# Patient Record
Sex: Female | Born: 1975 | Race: White | Hispanic: No | Marital: Married | Smoking: Former smoker
Health system: Southern US, Community
[De-identification: ages and names within clinical notes are randomized; demographics above are authoritative.]

## PROBLEM LIST (undated history)

## (undated) DIAGNOSIS — G500 Trigeminal neuralgia: Secondary | ICD-10-CM

## (undated) DIAGNOSIS — I499 Cardiac arrhythmia, unspecified: Secondary | ICD-10-CM

## (undated) DIAGNOSIS — Z8719 Personal history of other diseases of the digestive system: Secondary | ICD-10-CM

## (undated) DIAGNOSIS — F419 Anxiety disorder, unspecified: Secondary | ICD-10-CM

## (undated) DIAGNOSIS — I739 Peripheral vascular disease, unspecified: Secondary | ICD-10-CM

## (undated) HISTORY — DX: Personal history of other diseases of the digestive system: Z87.19

## (undated) HISTORY — PX: NO PAST SURGERIES: SHX2092

---

## 2001-03-25 ENCOUNTER — Emergency Department (HOSPITAL_COMMUNITY): Admission: EM | Admit: 2001-03-25 | Discharge: 2001-03-25 | Payer: Self-pay

## 2003-10-15 ENCOUNTER — Inpatient Hospital Stay (HOSPITAL_COMMUNITY): Admission: AD | Admit: 2003-10-15 | Discharge: 2003-10-15 | Payer: Self-pay | Admitting: Obstetrics & Gynecology

## 2003-11-09 ENCOUNTER — Inpatient Hospital Stay (HOSPITAL_COMMUNITY): Admission: AD | Admit: 2003-11-09 | Discharge: 2003-11-09 | Payer: Self-pay | Admitting: Obstetrics and Gynecology

## 2003-11-09 ENCOUNTER — Inpatient Hospital Stay (HOSPITAL_COMMUNITY): Admission: AD | Admit: 2003-11-09 | Discharge: 2003-11-11 | Payer: Self-pay | Admitting: Obstetrics and Gynecology

## 2004-01-01 ENCOUNTER — Other Ambulatory Visit: Admission: RE | Admit: 2004-01-01 | Discharge: 2004-01-01 | Payer: Self-pay | Admitting: Obstetrics and Gynecology

## 2004-09-08 ENCOUNTER — Ambulatory Visit: Payer: Self-pay | Admitting: Internal Medicine

## 2005-01-26 ENCOUNTER — Ambulatory Visit: Payer: Self-pay | Admitting: Internal Medicine

## 2005-03-24 ENCOUNTER — Other Ambulatory Visit: Admission: RE | Admit: 2005-03-24 | Discharge: 2005-03-24 | Payer: Self-pay | Admitting: Obstetrics and Gynecology

## 2007-01-08 ENCOUNTER — Emergency Department (HOSPITAL_COMMUNITY): Admission: EM | Admit: 2007-01-08 | Discharge: 2007-01-08 | Payer: Self-pay | Admitting: Emergency Medicine

## 2009-03-25 ENCOUNTER — Ambulatory Visit (HOSPITAL_COMMUNITY)
Admission: RE | Admit: 2009-03-25 | Discharge: 2009-03-25 | Payer: Self-pay | Admitting: Physical Medicine and Rehabilitation

## 2010-03-12 ENCOUNTER — Ambulatory Visit (HOSPITAL_COMMUNITY)
Admission: RE | Admit: 2010-03-12 | Discharge: 2010-03-12 | Payer: Self-pay | Admitting: Physical Medicine and Rehabilitation

## 2010-10-01 IMAGING — CR DG LUMBAR SPINE COMPLETE 4+V
5 series · 5 of 5 positions shown · non-contrast
Comparison: None.

CLINICAL DATA: Low back pain

LUMBAR SPINE - COMPLETE 4+ VIEW

[view not recorded (1 of 5)]
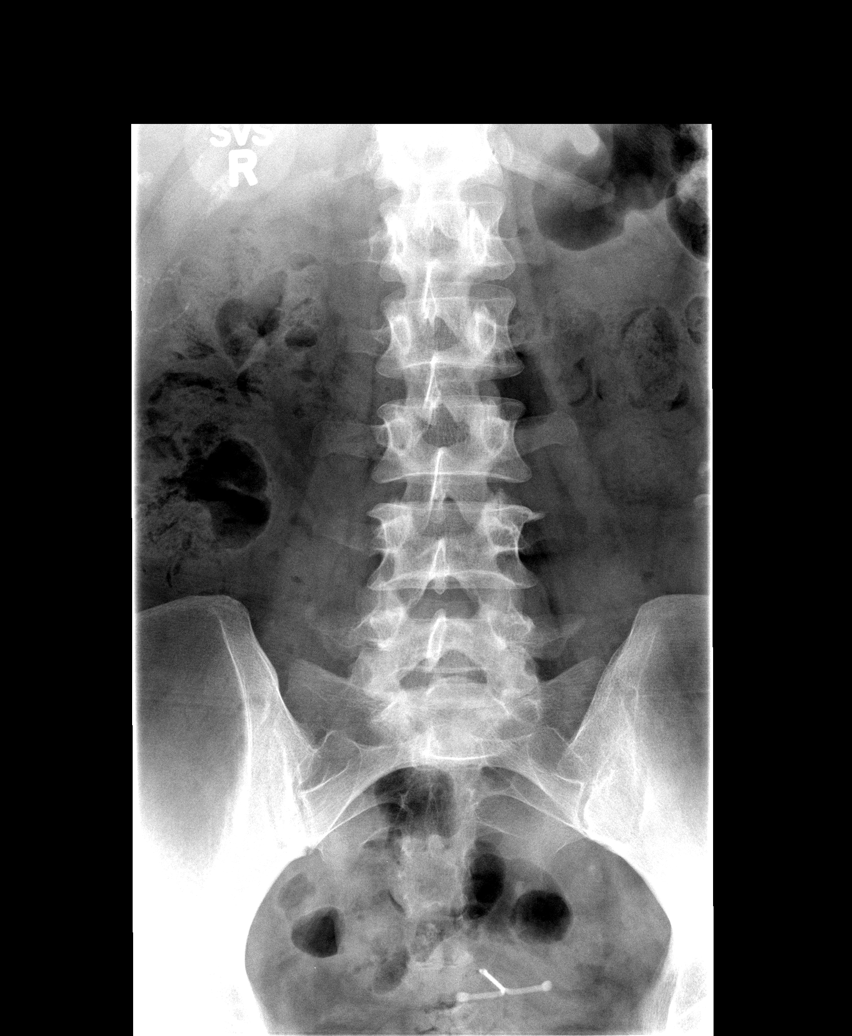

[view not recorded (2 of 5)]
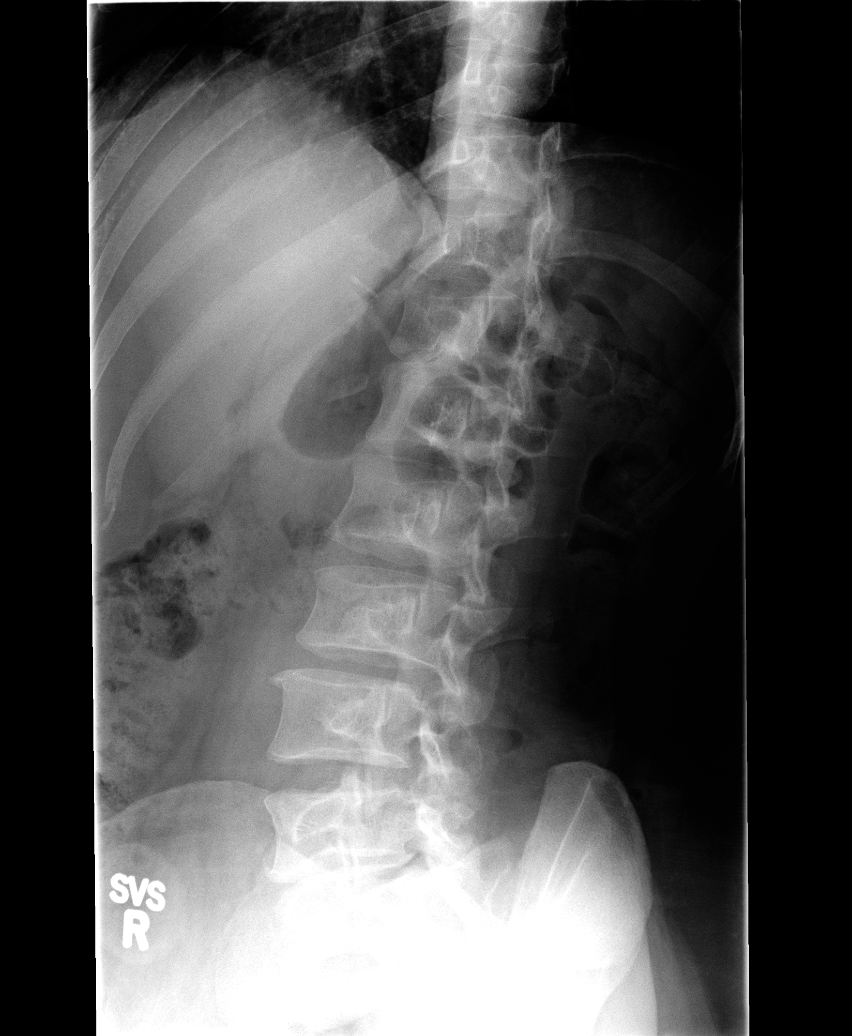

[view not recorded (3 of 5)]
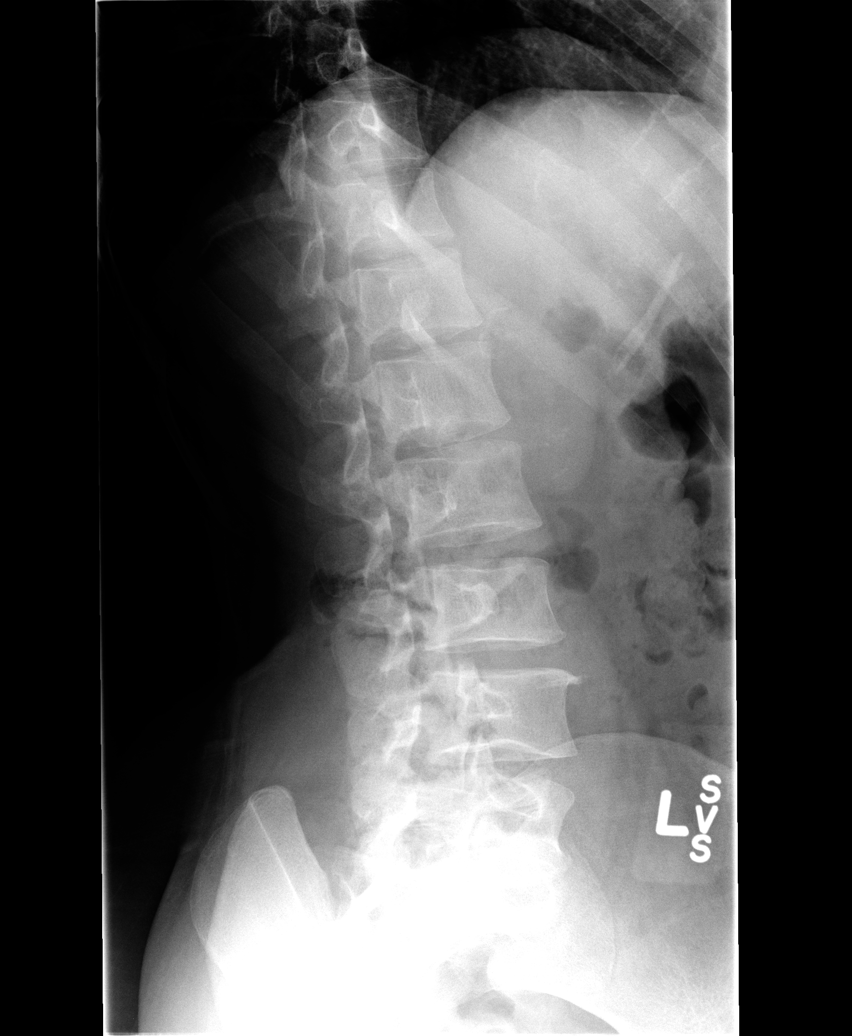

[view not recorded (4 of 5)]
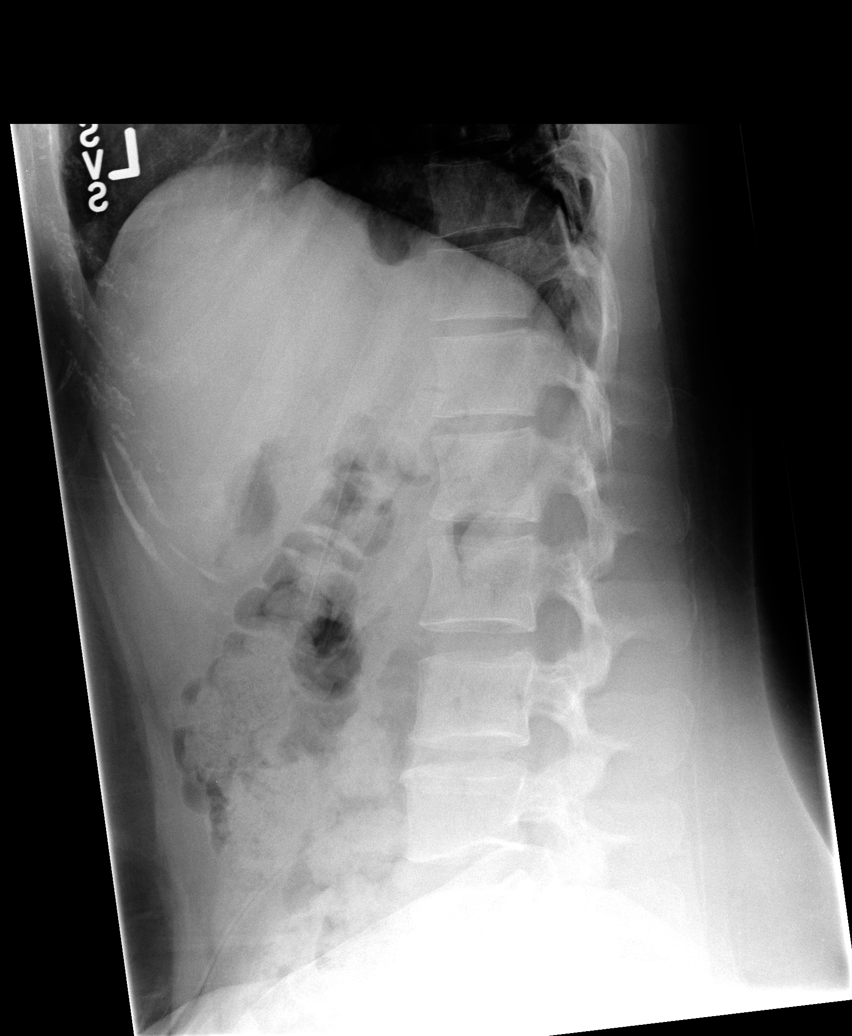

[view not recorded (5 of 5)]
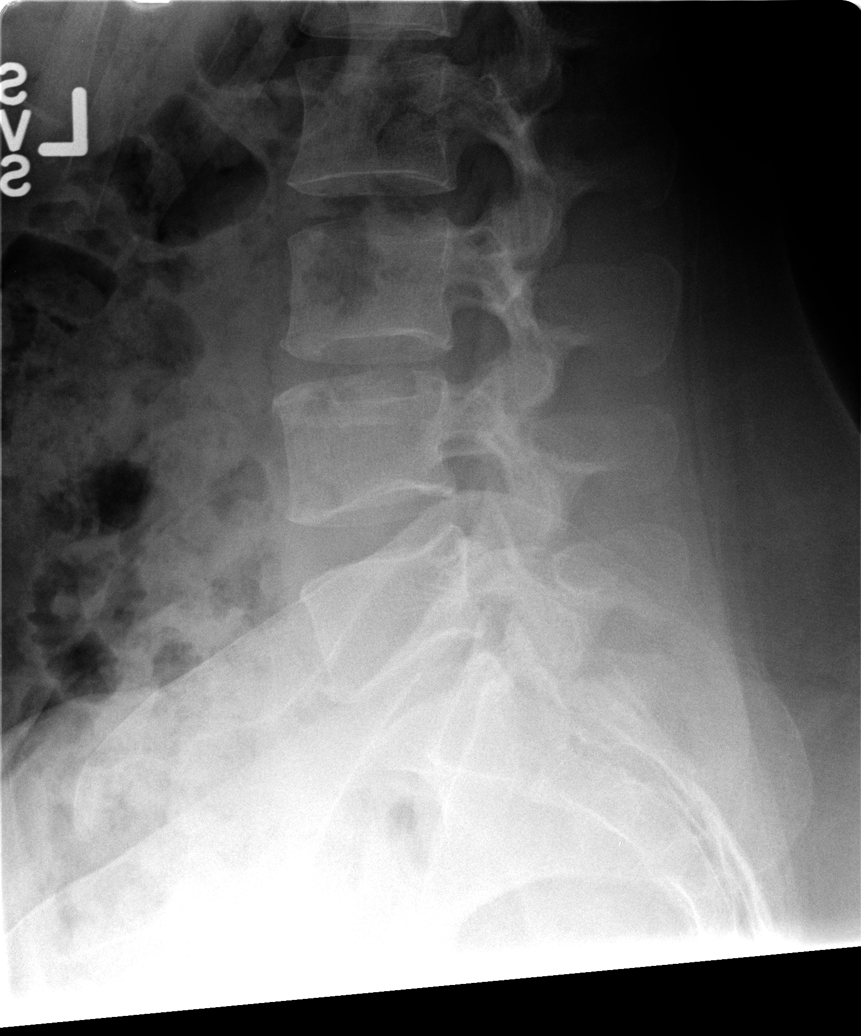

[5 of 5 positions shown; findings below may reference images not displayed]

FINDINGS: Normal alignment of the lumbar vertebral bodies.  No loss
vertebral body height or disc height.  No subluxation.  There is
mild endplate osteophytosis and L1-L5.  Oblique projections
demonstrate no evidence of pars fracture.
IMPRESSION: 1.  No acute findings of the lumbar spine.
2.  Mild disc osteophytic disease of the lower lumbar spine.

## 2013-05-09 ENCOUNTER — Ambulatory Visit (HOSPITAL_COMMUNITY)
Admission: RE | Admit: 2013-05-09 | Discharge: 2013-05-09 | Disposition: A | Discharge status: Home / Self Care | Payer: BC Managed Care – PPO | Source: Ambulatory Visit | Attending: Physical Medicine and Rehabilitation | Admitting: Physical Medicine and Rehabilitation

## 2013-05-09 ENCOUNTER — Other Ambulatory Visit (HOSPITAL_COMMUNITY): Payer: Self-pay | Admitting: Physical Medicine and Rehabilitation

## 2013-05-09 DIAGNOSIS — M545 Low back pain, unspecified: Secondary | ICD-10-CM | POA: Insufficient documentation

## 2013-05-09 DIAGNOSIS — M51379 Other intervertebral disc degeneration, lumbosacral region without mention of lumbar back pain or lower extremity pain: Secondary | ICD-10-CM | POA: Insufficient documentation

## 2013-05-09 DIAGNOSIS — M47817 Spondylosis without myelopathy or radiculopathy, lumbosacral region: Secondary | ICD-10-CM

## 2013-05-09 DIAGNOSIS — G894 Chronic pain syndrome: Secondary | ICD-10-CM

## 2013-05-09 DIAGNOSIS — M5137 Other intervertebral disc degeneration, lumbosacral region: Secondary | ICD-10-CM | POA: Insufficient documentation

## 2013-11-16 ENCOUNTER — Ambulatory Visit (INDEPENDENT_AMBULATORY_CARE_PROVIDER_SITE_OTHER): Payer: BC Managed Care – PPO | Admitting: Family Medicine

## 2013-11-16 ENCOUNTER — Encounter: Payer: Self-pay | Admitting: Family Medicine

## 2013-11-16 VITALS — BP 108/68 | HR 77 | Temp 98.3°F | Ht 65.5 in | Wt 227.8 lb

## 2013-11-16 DIAGNOSIS — F411 Generalized anxiety disorder: Secondary | ICD-10-CM

## 2013-11-16 DIAGNOSIS — Z720 Tobacco use: Secondary | ICD-10-CM | POA: Insufficient documentation

## 2013-11-16 DIAGNOSIS — L408 Other psoriasis: Secondary | ICD-10-CM

## 2013-11-16 DIAGNOSIS — D179 Benign lipomatous neoplasm, unspecified: Secondary | ICD-10-CM

## 2013-11-16 DIAGNOSIS — Z23 Encounter for immunization: Secondary | ICD-10-CM

## 2013-11-16 DIAGNOSIS — F17200 Nicotine dependence, unspecified, uncomplicated: Secondary | ICD-10-CM

## 2013-11-16 DIAGNOSIS — R5381 Other malaise: Secondary | ICD-10-CM

## 2013-11-16 DIAGNOSIS — F419 Anxiety disorder, unspecified: Secondary | ICD-10-CM | POA: Insufficient documentation

## 2013-11-16 DIAGNOSIS — K219 Gastro-esophageal reflux disease without esophagitis: Secondary | ICD-10-CM | POA: Insufficient documentation

## 2013-11-16 DIAGNOSIS — L409 Psoriasis, unspecified: Secondary | ICD-10-CM

## 2013-11-16 DIAGNOSIS — R5383 Other fatigue: Secondary | ICD-10-CM

## 2013-11-16 LAB — CBC WITH DIFFERENTIAL/PLATELET
BASOS PCT: 1 % (ref 0–1)
Basophils Absolute: 0.1 10*3/uL (ref 0.0–0.1)
Eosinophils Absolute: 0.1 10*3/uL (ref 0.0–0.7)
Eosinophils Relative: 1 % (ref 0–5)
HEMATOCRIT: 43.4 % (ref 36.0–46.0)
Hemoglobin: 14.9 g/dL (ref 12.0–15.0)
Lymphocytes Relative: 31 % (ref 12–46)
Lymphs Abs: 3.2 10*3/uL (ref 0.7–4.0)
MCH: 31.4 pg (ref 26.0–34.0)
MCHC: 34.3 g/dL (ref 30.0–36.0)
MCV: 91.4 fL (ref 78.0–100.0)
MONO ABS: 0.7 10*3/uL (ref 0.1–1.0)
MONOS PCT: 7 % (ref 3–12)
Neutro Abs: 6.2 10*3/uL (ref 1.7–7.7)
Neutrophils Relative %: 60 % (ref 43–77)
Platelets: 200 10*3/uL (ref 150–400)
RBC: 4.75 MIL/uL (ref 3.87–5.11)
RDW: 13.2 % (ref 11.5–15.5)
WBC: 10.4 10*3/uL (ref 4.0–10.5)

## 2013-11-16 NOTE — Progress Notes (Signed)
Pre visit review using our clinic review tool, if applicable. No additional management support is needed unless otherwise documented below in the visit note. 

## 2013-11-16 NOTE — Patient Instructions (Addendum)
Tdap vaccine today  Labs today  We will refer you to counselor- stop at check out  Please work on quitting smoking    Smoking Cessation Quitting smoking is important to your health and has many advantages. However, it is not always easy to quit since nicotine is a very addictive drug. Often times, people try 3 times or more before being able to quit. This document explains the best ways for you to prepare to quit smoking. Quitting takes hard work and a lot of effort, but you can do it. ADVANTAGES OF QUITTING SMOKING  You will live longer, feel better, and live better.  Your body will feel the impact of quitting smoking almost immediately.  Within 20 minutes, blood pressure decreases. Your pulse returns to its normal level.  After 8 hours, carbon monoxide levels in the blood return to normal. Your oxygen level increases.  After 24 hours, the chance of having a heart attack starts to decrease. Your breath, hair, and body stop smelling like smoke.  After 48 hours, damaged nerve endings begin to recover. Your sense of taste and smell improve.  After 72 hours, the body is virtually free of nicotine. Your bronchial tubes relax and breathing becomes easier.  After 2 to 12 weeks, lungs can hold more air. Exercise becomes easier and circulation improves.  The risk of having a heart attack, stroke, cancer, or lung disease is greatly reduced.  After 1 year, the risk of coronary heart disease is cut in half.  After 5 years, the risk of stroke falls to the same as a nonsmoker.  After 10 years, the risk of lung cancer is cut in half and the risk of other cancers decreases significantly.  After 15 years, the risk of coronary heart disease drops, usually to the level of a nonsmoker.  If you are pregnant, quitting smoking will improve your chances of having a healthy baby.  The people you live with, especially any children, will be healthier.  You will have extra money to spend on things other  than cigarettes. QUESTIONS TO THINK ABOUT BEFORE ATTEMPTING TO QUIT You may want to talk about your answers with your caregiver.  Why do you want to quit?  If you tried to quit in the past, what helped and what did not?  What will be the most difficult situations for you after you quit? How will you plan to handle them?  Who can help you through the tough times? Your family? Friends? A caregiver?  What pleasures do you get from smoking? What ways can you still get pleasure if you quit? Here are some questions to ask your caregiver:  How can you help me to be successful at quitting?  What medicine do you think would be best for me and how should I take it?  What should I do if I need more help?  What is smoking withdrawal like? How can I get information on withdrawal? GET READY  Set a quit date.  Change your environment by getting rid of all cigarettes, ashtrays, matches, and lighters in your home, car, or work. Do not let people smoke in your home.  Review your past attempts to quit. Think about what worked and what did not. GET SUPPORT AND ENCOURAGEMENT You have a better chance of being successful if you have help. You can get support in many ways.  Tell your family, friends, and co-workers that you are going to quit and need their support. Ask them not to smoke  around you.  Get individual, group, or telephone counseling and support. Programs are available at [employer] and health centers. Call your local health department for information about programs in your area.  Spiritual beliefs and practices may help some smokers quit.  Download a "quit meter" on your computer to keep track of quit statistics, such as how long you have gone without smoking, cigarettes not smoked, and money saved.  Get a self-help book about quitting smoking and staying off of tobacco. Sidney yourself from urges to smoke. Talk to someone, go for a walk, or  occupy your time with a task.  Change your normal routine. Take a different route to work. Drink tea instead of coffee. Eat breakfast in a different place.  Reduce your stress. Take a hot bath, exercise, or read a book.  Plan something enjoyable to do every day. Reward yourself for not smoking.  Explore interactive web-based programs that specialize in helping you quit. GET MEDICINE AND USE IT CORRECTLY Medicines can help you stop smoking and decrease the urge to smoke. Combining medicine with the above behavioral methods and support can greatly increase your chances of successfully quitting smoking.  Nicotine replacement therapy helps deliver nicotine to your body without the negative effects and risks of smoking. Nicotine replacement therapy includes nicotine gum, lozenges, inhalers, nasal sprays, and skin patches. Some may be available over-the-counter and others require a prescription.  Antidepressant medicine helps people abstain from smoking, but how this works is unknown. This medicine is available by prescription.  Nicotinic receptor partial agonist medicine simulates the effect of nicotine in your brain. This medicine is available by prescription. Ask your caregiver for advice about which medicines to use and how to use them based on your health history. Your caregiver will tell you what side effects to look out for if you choose to be on a medicine or therapy. Carefully read the information on the package. Do not use any other product containing nicotine while using a nicotine replacement product.  RELAPSE OR DIFFICULT SITUATIONS Most relapses occur within the first 3 months after quitting. Do not be discouraged if you start smoking again. Remember, most people try several times before finally quitting. You may have symptoms of withdrawal because your body is used to nicotine. You may crave cigarettes, be irritable, feel very hungry, cough often, get headaches, or have difficulty  concentrating. The withdrawal symptoms are only temporary. They are strongest when you first quit, but they will go away within 10 14 days. To reduce the chances of relapse, try to:  Avoid drinking alcohol. Drinking lowers your chances of successfully quitting.  Reduce the amount of caffeine you consume. Once you quit smoking, the amount of caffeine in your body increases and can give you symptoms, such as a rapid heartbeat, sweating, and anxiety.  Avoid smokers because they can make you want to smoke.  Do not let weight gain distract you. Many smokers will gain weight when they quit, usually less than 10 pounds. Eat a healthy diet and stay active. You can always lose the weight gained after you quit.  Find ways to improve your mood other than smoking. FOR MORE INFORMATION  www.smokefree.gov  Document Released: 06/01/2001 Document Revised: 12/07/2011 Document Reviewed: 09/16/2011 [facility] Patient Information 2014 [place], [place].

## 2013-11-16 NOTE — Progress Notes (Signed)
Subjective:    Patient ID: Emily Reynolds, female    DOB: 05-07-1976, 38 y.o.   MRN: [id number]  HPI Here to establish for PCP    Gyn Dr Corinna Capra  Last exam and pap 2 years ago  Mammogram at 42  Has mirena IUD and likes it   Has 39 year old and 21 year old daughter   Td - ? 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   Smoker - 1ppd / for 15 years - she really wants to quit someday  Has quit twice in her lifetime  Very difficult for her  Some stressors      Anxiety problem  (a lot of worry) Stems from stress regarding older daughter  Was on celexa 40 mg (and it made her gain a lot of weight) - and tried lexapro which worked for a while  Also xanax  Does not want to take med at this time-is interested in a counseling referral   Is exercising now - has lost 9 lb in about 3 weeks  Was frustrated with her weight - going walking with family  Also eating healthy  Drinking more water also   Needs wellness labs - also   Patient Active Problem List   Diagnosis Date Noted  . Tobacco use disorder 11/16/2013  . GERD (gastroesophageal reflux disease) 11/16/2013  . Anxiety 11/16/2013   Past Medical History  Diagnosis Date  . History of gastroesophageal reflux (GERD)    No past surgical history on file. History  Substance Use Topics  . Smoking status: Current Every Day Smoker -- 1.00 packs/day    Types: Cigarettes  . Smokeless tobacco: Not on file  . Alcohol Use: No   Family History  Problem Relation Age of Onset  . Lung cancer Father   . Diabetes Mother    Allergies  Allergen Reactions  . Keflex [Cephalexin]     CAUSES BAD HEADACHES   No current outpatient prescriptions on file prior to visit.   No current facility-administered medications on file prior to visit.     Review of Systems Review of Systems  Constitutional: Negative for fever, appetite change, and unexpected weight change. pos for fatigue  Eyes:  Negative for pain and visual disturbance.  Respiratory: Negative for cough and shortness of breath.   Cardiovascular: Negative for cp or palpitations    Gastrointestinal: Negative for nausea, diarrhea and constipation.  Genitourinary: Negative for urgency and frequency.  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  Hematological: Negative for adenopathy. Does not bruise/bleed easily.  Psychiatric/Behavioral: Negative for dysphoric mood. The patient is not nervous/anxious.         Objective:   Physical Exam  Constitutional: She appears well-developed and well-nourished. No distress.  obese and well appearing    HENT:  Head: Normocephalic and atraumatic.  Right Ear: External ear normal.  Left Ear: External ear normal.  Nose: Nose normal.  Mouth/Throat: Oropharynx is clear and moist.  Eyes: Conjunctivae and EOM are normal. Pupils are equal, round, and reactive to light. Right eye exhibits no discharge. Left eye exhibits no discharge. No scleral icterus.  Neck: Normal range of motion. Neck supple. No JVD present.  No thyromegaly present.  Cardiovascular: Normal rate, regular rhythm, normal heart sounds and intact distal pulses.  Exam reveals no gallop.   Pulmonary/Chest: Effort normal and breath sounds normal. No respiratory distress. She has no wheezes. She has no rales.  Mildly distant bs w/o wheeze   Abdominal: Soft. Bowel sounds are normal. She exhibits no distension and no mass. There is no tenderness.  Musculoskeletal: She exhibits no edema and no tenderness.  Lipoma noted on R upper back   Lymphadenopathy:    She has no cervical adenopathy.  Neurological: She is alert. She has normal reflexes. No cranial nerve deficit. She exhibits normal muscle tone. Coordination normal.    Skin: Skin is warm and dry. No rash noted. No erythema. No pallor.  Some plaque with scale on medial feet-per pt baseline for her  Psychiatric: Her speech is normal and behavior is normal. Thought content normal. Her mood appears anxious. Her affect is not blunt and not labile. She does not exhibit a depressed mood.          Assessment & Plan:

## 2013-11-17 ENCOUNTER — Telehealth: Payer: Self-pay | Admitting: Family Medicine

## 2013-11-17 LAB — COMPREHENSIVE METABOLIC PANEL
ALBUMIN: 4.1 g/dL (ref 3.5–5.2)
ALT: 15 U/L (ref 0–35)
AST: 17 U/L (ref 0–37)
Alkaline Phosphatase: 76 U/L (ref 39–117)
BUN: 12 mg/dL (ref 6–23)
CALCIUM: 9.4 mg/dL (ref 8.4–10.5)
CHLORIDE: 105 meq/L (ref 96–112)
CO2: 23 mEq/L (ref 19–32)
Creat: 0.69 mg/dL (ref 0.50–1.10)
GLUCOSE: 85 mg/dL (ref 70–99)
Potassium: 4.6 mEq/L (ref 3.5–5.3)
SODIUM: 139 meq/L (ref 135–145)
TOTAL PROTEIN: 7.1 g/dL (ref 6.0–8.3)
Total Bilirubin: 0.6 mg/dL (ref 0.2–1.2)

## 2013-11-17 LAB — LIPID PANEL
CHOL/HDL RATIO: 4 ratio
CHOLESTEROL: 187 mg/dL (ref 0–200)
HDL: 47 mg/dL (ref >39)
LDL Cholesterol: 124 mg/dL — ABNORMAL HIGH (ref 0–99)
Triglycerides: 80 mg/dL (ref <150)
VLDL: 16 mg/dL (ref 0–40)

## 2013-11-17 LAB — TSH: TSH: 1.393 u[IU]/mL (ref 0.350–4.500)

## 2013-11-17 NOTE — Telephone Encounter (Signed)
Relevant patient education mailed to patient.  

## 2013-11-18 NOTE — Assessment & Plan Note (Signed)
Not bothersome Will obs

## 2013-11-18 NOTE — Assessment & Plan Note (Signed)
Mild/ limited to feet / has used topical tx in the past  No joint involvement

## 2013-11-18 NOTE — Assessment & Plan Note (Addendum)
Reviewed stressors/ coping techniques/symptoms/ support sources/ tx options and side effects in detail today Med in the past  This is more situational and pt wants to avoid med  Ref to counseling No SI >25 minutes spent in face to face time with patient, >50% spent in counselling or coordination of care

## 2013-11-18 NOTE — Assessment & Plan Note (Signed)
Lab today  Poss due to schedule/ mood issue/(anx and worry) Enc her exercise effort

## 2013-11-18 NOTE — Assessment & Plan Note (Signed)
Disc in detail risks of smoking and possible outcomes including copd, vascular/ heart disease, cancer , respiratory and sinus infections  Pt voices understanding She is not ready to quit yet  

## 2013-11-20 ENCOUNTER — Encounter: Payer: Self-pay | Admitting: *Deleted

## 2020-07-15 ENCOUNTER — Other Ambulatory Visit: Payer: Self-pay | Admitting: Physical Medicine and Rehabilitation

## 2020-07-15 ENCOUNTER — Other Ambulatory Visit: Payer: Self-pay | Admitting: Pediatrics

## 2020-07-15 DIAGNOSIS — G500 Trigeminal neuralgia: Secondary | ICD-10-CM

## 2020-07-19 ENCOUNTER — Other Ambulatory Visit: Payer: Self-pay

## 2020-07-30 ENCOUNTER — Other Ambulatory Visit: Payer: Self-pay

## 2020-08-05 ENCOUNTER — Ambulatory Visit
Admission: RE | Admit: 2020-08-05 | Discharge: 2020-08-05 | Disposition: A | Discharge status: Home / Self Care | Payer: Self-pay | Source: Ambulatory Visit | Attending: Physical Medicine and Rehabilitation | Admitting: Physical Medicine and Rehabilitation

## 2020-08-05 ENCOUNTER — Other Ambulatory Visit: Payer: Self-pay

## 2020-08-05 DIAGNOSIS — G500 Trigeminal neuralgia: Secondary | ICD-10-CM

## 2023-03-22 DIAGNOSIS — H9201 Otalgia, right ear: Secondary | ICD-10-CM | POA: Insufficient documentation

## 2023-03-22 DIAGNOSIS — Z1321 Encounter for screening for nutritional disorder: Secondary | ICD-10-CM | POA: Insufficient documentation

## 2023-03-22 DIAGNOSIS — R600 Localized edema: Secondary | ICD-10-CM | POA: Insufficient documentation

## 2023-03-22 DIAGNOSIS — H6121 Impacted cerumen, right ear: Secondary | ICD-10-CM | POA: Insufficient documentation

## 2023-03-22 DIAGNOSIS — Z131 Encounter for screening for diabetes mellitus: Secondary | ICD-10-CM | POA: Diagnosis not present

## 2023-03-22 DIAGNOSIS — R52 Pain, unspecified: Secondary | ICD-10-CM | POA: Diagnosis not present

## 2023-03-22 DIAGNOSIS — Z13228 Encounter for screening for other metabolic disorders: Secondary | ICD-10-CM | POA: Insufficient documentation

## 2023-03-22 DIAGNOSIS — Z13220 Encounter for screening for lipoid disorders: Secondary | ICD-10-CM | POA: Diagnosis not present

## 2023-04-05 DIAGNOSIS — Z13220 Encounter for screening for lipoid disorders: Secondary | ICD-10-CM | POA: Diagnosis not present

## 2023-04-05 DIAGNOSIS — Z13228 Encounter for screening for other metabolic disorders: Secondary | ICD-10-CM | POA: Diagnosis not present

## 2023-04-05 DIAGNOSIS — Z1321 Encounter for screening for nutritional disorder: Secondary | ICD-10-CM | POA: Diagnosis not present

## 2023-09-20 DIAGNOSIS — I499 Cardiac arrhythmia, unspecified: Secondary | ICD-10-CM | POA: Diagnosis not present

## 2023-09-20 DIAGNOSIS — H9201 Otalgia, right ear: Secondary | ICD-10-CM | POA: Diagnosis not present

## 2023-09-20 DIAGNOSIS — R42 Dizziness and giddiness: Secondary | ICD-10-CM | POA: Diagnosis not present

## 2023-09-20 DIAGNOSIS — G500 Trigeminal neuralgia: Secondary | ICD-10-CM | POA: Diagnosis not present

## 2023-09-20 DIAGNOSIS — R52 Pain, unspecified: Secondary | ICD-10-CM | POA: Diagnosis not present

## 2023-09-20 DIAGNOSIS — R600 Localized edema: Secondary | ICD-10-CM | POA: Diagnosis not present

## 2023-09-21 DIAGNOSIS — D519 Vitamin B12 deficiency anemia, unspecified: Secondary | ICD-10-CM | POA: Diagnosis not present

## 2023-09-21 DIAGNOSIS — R600 Localized edema: Secondary | ICD-10-CM | POA: Diagnosis not present

## 2023-09-21 DIAGNOSIS — H9201 Otalgia, right ear: Secondary | ICD-10-CM | POA: Diagnosis not present

## 2023-09-21 DIAGNOSIS — G500 Trigeminal neuralgia: Secondary | ICD-10-CM | POA: Diagnosis not present

## 2023-09-21 DIAGNOSIS — E559 Vitamin D deficiency, unspecified: Secondary | ICD-10-CM | POA: Diagnosis not present

## 2023-09-21 DIAGNOSIS — R52 Pain, unspecified: Secondary | ICD-10-CM | POA: Diagnosis not present

## 2023-09-30 DIAGNOSIS — R799 Abnormal finding of blood chemistry, unspecified: Secondary | ICD-10-CM | POA: Diagnosis not present

## 2023-09-30 DIAGNOSIS — I499 Cardiac arrhythmia, unspecified: Secondary | ICD-10-CM | POA: Diagnosis not present

## 2023-12-13 DIAGNOSIS — E538 Deficiency of other specified B group vitamins: Secondary | ICD-10-CM | POA: Diagnosis not present

## 2023-12-13 DIAGNOSIS — R600 Localized edema: Secondary | ICD-10-CM | POA: Diagnosis not present

## 2023-12-13 DIAGNOSIS — L409 Psoriasis, unspecified: Secondary | ICD-10-CM | POA: Diagnosis not present

## 2023-12-13 DIAGNOSIS — R42 Dizziness and giddiness: Secondary | ICD-10-CM | POA: Diagnosis not present

## 2023-12-20 DIAGNOSIS — H04123 Dry eye syndrome of bilateral lacrimal glands: Secondary | ICD-10-CM | POA: Diagnosis not present

## 2023-12-20 DIAGNOSIS — H04213 Epiphora due to excess lacrimation, bilateral lacrimal glands: Secondary | ICD-10-CM | POA: Diagnosis not present

## 2023-12-20 DIAGNOSIS — R9431 Abnormal electrocardiogram [ECG] [EKG]: Secondary | ICD-10-CM | POA: Insufficient documentation

## 2023-12-20 DIAGNOSIS — H04203 Unspecified epiphora, bilateral lacrimal glands: Secondary | ICD-10-CM | POA: Insufficient documentation

## 2023-12-20 DIAGNOSIS — H0279 Other degenerative disorders of eyelid and periocular area: Secondary | ICD-10-CM | POA: Diagnosis not present

## 2023-12-20 DIAGNOSIS — I639 Cerebral infarction, unspecified: Secondary | ICD-10-CM

## 2023-12-20 HISTORY — DX: Cerebral infarction, unspecified: I63.9

## 2023-12-28 ENCOUNTER — Encounter (HOSPITAL_COMMUNITY): Payer: Self-pay | Admitting: Emergency Medicine

## 2023-12-28 ENCOUNTER — Emergency Department (HOSPITAL_COMMUNITY)
Admission: EM | Admit: 2023-12-28 | Discharge: 2023-12-28 | Discharge status: Against Medical Advice | Attending: Emergency Medicine | Admitting: Emergency Medicine

## 2023-12-28 ENCOUNTER — Emergency Department (HOSPITAL_COMMUNITY)

## 2023-12-28 DIAGNOSIS — R519 Headache, unspecified: Secondary | ICD-10-CM | POA: Insufficient documentation

## 2023-12-28 DIAGNOSIS — Z5321 Procedure and treatment not carried out due to patient leaving prior to being seen by health care provider: Secondary | ICD-10-CM | POA: Insufficient documentation

## 2023-12-28 DIAGNOSIS — H5500 Unspecified nystagmus: Secondary | ICD-10-CM | POA: Diagnosis not present

## 2023-12-28 DIAGNOSIS — R29810 Facial weakness: Secondary | ICD-10-CM | POA: Diagnosis not present

## 2023-12-28 DIAGNOSIS — H02402 Unspecified ptosis of left eyelid: Secondary | ICD-10-CM | POA: Insufficient documentation

## 2023-12-28 DIAGNOSIS — R29818 Other symptoms and signs involving the nervous system: Secondary | ICD-10-CM | POA: Diagnosis not present

## 2023-12-28 DIAGNOSIS — R278 Other lack of coordination: Secondary | ICD-10-CM | POA: Diagnosis not present

## 2023-12-28 DIAGNOSIS — I720 Aneurysm of carotid artery: Secondary | ICD-10-CM | POA: Diagnosis not present

## 2023-12-28 DIAGNOSIS — I739 Peripheral vascular disease, unspecified: Secondary | ICD-10-CM | POA: Diagnosis not present

## 2023-12-28 LAB — CBC WITH DIFFERENTIAL/PLATELET
Abs Immature Granulocytes: 0.02 K/uL (ref 0.00–0.07)
Basophils Absolute: 0.1 K/uL (ref 0.0–0.1)
Basophils Relative: 1 %
Eosinophils Absolute: 0.1 K/uL (ref 0.0–0.5)
Eosinophils Relative: 1 %
HCT: 40.9 % (ref 36.0–46.0)
Hemoglobin: 14 g/dL (ref 12.0–15.0)
Immature Granulocytes: 0 %
Lymphocytes Relative: 24 %
Lymphs Abs: 1.6 K/uL (ref 0.7–4.0)
MCH: 31.5 pg (ref 26.0–34.0)
MCHC: 34.2 g/dL (ref 30.0–36.0)
MCV: 92.1 fL (ref 80.0–100.0)
Monocytes Absolute: 0.5 K/uL (ref 0.1–1.0)
Monocytes Relative: 8 %
Neutro Abs: 4.4 K/uL (ref 1.7–7.7)
Neutrophils Relative %: 66 %
Platelets: 166 K/uL (ref 150–400)
RBC: 4.44 MIL/uL (ref 3.87–5.11)
RDW: 12.3 % (ref 11.5–15.5)
WBC: 6.6 K/uL (ref 4.0–10.5)
nRBC: 0 % (ref 0.0–0.2)

## 2023-12-28 LAB — COMPREHENSIVE METABOLIC PANEL WITH GFR
ALT: 11 U/L (ref 0–44)
AST: 16 U/L (ref 15–41)
Albumin: 3.8 g/dL (ref 3.5–5.0)
Alkaline Phosphatase: 63 U/L (ref 38–126)
Anion gap: 10 (ref 5–15)
BUN: 9 mg/dL (ref 6–20)
CO2: 24 mmol/L (ref 22–32)
Calcium: 9.3 mg/dL (ref 8.9–10.3)
Chloride: 106 mmol/L (ref 98–111)
Creatinine, Ser: 0.72 mg/dL (ref 0.44–1.00)
GFR, Estimated: >60 mL/min (ref >60)
Glucose, Bld: 103 mg/dL — ABNORMAL HIGH (ref 70–99)
Potassium: 3.9 mmol/L (ref 3.5–5.1)
Sodium: 140 mmol/L (ref 135–145)
Total Bilirubin: 0.8 mg/dL (ref 0.0–1.2)
Total Protein: 7.1 g/dL (ref 6.5–8.1)

## 2023-12-28 LAB — HCG, SERUM, QUALITATIVE: Preg, Serum: POSITIVE — AB

## 2023-12-28 NOTE — ED Notes (Signed)
 Patient refused vitals.

## 2023-12-28 NOTE — ED Provider Notes (Addendum)
 21: 23 patient is leaving without completing treatment.  I have not been able to see her in the room yet.  She is in the hallway and I have requested that she stay for completion of  history and physical exam by me.  Patient reports that she came to get a CT based on what her doctor told her,  and she sees in MyChart that her CT result is negative.  She reports that when she came for a CT scan and now that the result is in, she does not want to wait any longer for other evaluation.  I advised the patient that on her lab work the serum qualitative pregnancy test is positive.  She reports that is impossible and advises that in that case, she is definitely leaving because she knows it is incorrect.  I counseled the patient that although the CT is negative in terms of interpretation, this does not definitively rule out serious or emergent neurologic conditions.  Patient is alert and ambulatory with clear mental status.  She reports she will be following up with her PCP who sent her over.  I have counseled the patient to return immediately should she have any concern for any additional symptoms or any worsening of her current symptom with the understanding that her evaluation is incomplete and emergent conditions are not ruled out based on a CT scan of the head alone.  Patient voices understanding and advises she will follow-up.  I apologized for patient having to wait but at this time I am working through multiple patients and trying to see everybody as soon as possible based on acuity.   Armenta Canning, MD 12/28/23 [phone]    Armenta Canning, MD 12/28/23 2130

## 2023-12-28 NOTE — ED Triage Notes (Signed)
 Pt here from home with c/o left eye drop yesterday for about 1 hour no other symptoms noted , ,MD sent pt over to r/o stroke and obtain a head ct

## 2023-12-28 NOTE — ED Provider Triage Note (Cosign Needed)
 Emergency Medicine Provider Triage Evaluation Note  Emily Reynolds , a 48 y.o. female  was evaluated in triage.  Pt complains of left eyelid droop. Reports this occurred on her way to work yesterday morning. She looked in the mirror and realized her eyelid was drooping on the left. It was not present when she woke up. Reports it lasted about 1 hour and resolved. She went to her pcp today and was told to come here for a CT. Reports she has had watery eyes since December, has had a normal eye exam and had a tear duct evaluation which was normal as well. Denies any symptoms currently. Denies any headaches, vision changes, facial droop, numbness/tingling, or weakness.  Review of Systems  Positive:  Negative:   Physical Exam  BP (!) 167/82 (BP Location: Right Arm)   Pulse 90   Temp 98.2 F (36.8 C)   Resp 16   SpO2 99%  Gen:   Awake, no distress   Resp:  Normal effort  MSK:   Moves extremities without difficulty  Other:  Bilateral conjunctival erythema reportedly from patient crying prior to arrival. Does seem to have excessive lacrimation.  Otherwise alert and oriented and neurologically intact without focal deficits.  Medical Decision Making  Medically screening exam initiated at 4:56 PM.  Appropriate orders placed.  Emily Reynolds was informed that the remainder of the evaluation will be completed by another provider, this initial triage assessment does not replace that evaluation, and the importance of remaining in the ED until their evaluation is complete.     Nora Lauraine LABOR, PA-C 12/28/23 1659

## 2023-12-28 NOTE — ED Notes (Signed)
 Pt left AMA. States she's been sitting here too long. Charge RN made aware. Pt refused to sign AMA form

## 2023-12-29 ENCOUNTER — Emergency Department (HOSPITAL_COMMUNITY)

## 2023-12-29 ENCOUNTER — Other Ambulatory Visit: Payer: Self-pay

## 2023-12-29 ENCOUNTER — Encounter (HOSPITAL_COMMUNITY): Payer: Self-pay | Admitting: Emergency Medicine

## 2023-12-29 ENCOUNTER — Emergency Department (HOSPITAL_COMMUNITY): Admission: EM | Admit: 2023-12-29 | Discharge: 2023-12-29 | Disposition: A | Discharge status: Home / Self Care

## 2023-12-29 DIAGNOSIS — E042 Nontoxic multinodular goiter: Secondary | ICD-10-CM | POA: Diagnosis not present

## 2023-12-29 DIAGNOSIS — H02402 Unspecified ptosis of left eyelid: Secondary | ICD-10-CM | POA: Insufficient documentation

## 2023-12-29 DIAGNOSIS — I671 Cerebral aneurysm, nonruptured: Secondary | ICD-10-CM | POA: Insufficient documentation

## 2023-12-29 DIAGNOSIS — R9082 White matter disease, unspecified: Secondary | ICD-10-CM | POA: Diagnosis not present

## 2023-12-29 DIAGNOSIS — R911 Solitary pulmonary nodule: Secondary | ICD-10-CM | POA: Insufficient documentation

## 2023-12-29 DIAGNOSIS — R891 Abnormal level of hormones in specimens from other organs, systems and tissues: Secondary | ICD-10-CM | POA: Diagnosis not present

## 2023-12-29 DIAGNOSIS — I720 Aneurysm of carotid artery: Secondary | ICD-10-CM | POA: Diagnosis not present

## 2023-12-29 DIAGNOSIS — I739 Peripheral vascular disease, unspecified: Secondary | ICD-10-CM | POA: Diagnosis not present

## 2023-12-29 DIAGNOSIS — Z7982 Long term (current) use of aspirin: Secondary | ICD-10-CM | POA: Diagnosis not present

## 2023-12-29 DIAGNOSIS — I6529 Occlusion and stenosis of unspecified carotid artery: Secondary | ICD-10-CM | POA: Diagnosis not present

## 2023-12-29 DIAGNOSIS — R221 Localized swelling, mass and lump, neck: Secondary | ICD-10-CM | POA: Diagnosis not present

## 2023-12-29 DIAGNOSIS — R7989 Other specified abnormal findings of blood chemistry: Secondary | ICD-10-CM

## 2023-12-29 LAB — I-STAT VENOUS BLOOD GAS, ED
Acid-Base Excess: 1 mmol/L (ref 0.0–2.0)
Bicarbonate: 25.2 mmol/L (ref 20.0–28.0)
Calcium, Ion: 1.1 mmol/L — ABNORMAL LOW (ref 1.15–1.40)
HCT: 41 % (ref 36.0–46.0)
Hemoglobin: 13.9 g/dL (ref 12.0–15.0)
O2 Saturation: 50 %
Potassium: 3.9 mmol/L (ref 3.5–5.1)
Sodium: 141 mmol/L (ref 135–145)
TCO2: 26 mmol/L (ref 22–32)
pCO2, Ven: 39.7 mmHg — ABNORMAL LOW (ref 44–60)
pH, Ven: 7.412 (ref 7.25–7.43)
pO2, Ven: 27 mmHg — CL (ref 32–45)

## 2023-12-29 LAB — CBC
HCT: 42.5 % (ref 36.0–46.0)
Hemoglobin: 14.3 g/dL (ref 12.0–15.0)
MCH: 31.4 pg (ref 26.0–34.0)
MCHC: 33.6 g/dL (ref 30.0–36.0)
MCV: 93.2 fL (ref 80.0–100.0)
Platelets: 161 K/uL (ref 150–400)
RBC: 4.56 MIL/uL (ref 3.87–5.11)
RDW: 12.3 % (ref 11.5–15.5)
WBC: 7.5 K/uL (ref 4.0–10.5)
nRBC: 0 % (ref 0.0–0.2)

## 2023-12-29 LAB — HCG, QUANTITATIVE, PREGNANCY: hCG, Beta Chain, Quant, S: 12 m[IU]/mL — ABNORMAL HIGH (ref <5)

## 2023-12-29 MED ORDER — IOHEXOL 350 MG/ML SOLN
75.0000 mL | Freq: Once | INTRAVENOUS | Status: AC | PRN
  Administered 2023-12-29: 75 mL via INTRAVENOUS

## 2023-12-29 MED ORDER — ASPIRIN 81 MG PO CHEW: 81.0000 mg | CHEWABLE_TABLET | Freq: Every day | ORAL | 1 refills | Status: DC

## 2023-12-29 MED ORDER — ASPIRIN 81 MG PO CHEW
81.0000 mg | CHEWABLE_TABLET | Freq: Once | ORAL | Status: AC
  Administered 2023-12-29: 81 mg via ORAL
  Filled 2023-12-29: qty 1

## 2023-12-29 NOTE — Plan of Care (Signed)
 Discussed briefly with Dr. Ula  This is a patient with stroke risk factors of smoking, BMI 37  Had a episode of left eyelid drooping for approximately 1 hour for which PCP sent her to the ED for stroke workup.  ABCD score appears to be 1-2 based on duration of symptoms (either less than an hour would be 1, greater than an hour would be 2)  Overall given isolated eyelid drooping, not highly concerning for acute vascular event based on history as provided.  However she also has a positive pregnancy test of unclear etiology, pending repeat  Certainly hyperestrogen state from some sort of malignancy driving positive pregnancy test would increase stroke risk factor  In this setting would be reasonable to obtain CTA head and neck, MRI brain in the ED and outpatient follow-up if reassuring and if she is not having any cardiac symptoms  If she is having cardiac symptoms reasonable to admit for full TIA workup and please notify neurology for full consultation  If MRI brain or CTA head and neck do reveal significant abnormalities please notify neurology for full consultation  Addendum: Discussed with ED provider 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 Ula who will notify patient of interprofession consultation

## 2023-12-29 NOTE — ED Provider Notes (Signed)
 The patient's hCG test is very mildly elevated on the quantitative hCG.  With her not being sexually active this does raise concern for malignancy.  CT scan of her abdomen is ordered in addition to the CT angiogram recommended by neurology to evaluate for malignancy.  I discussed this with the patient and she is amenable to CT scan at this time.   Ula Prentice SAUNDERS, MD 12/29/23 [phone]

## 2023-12-29 NOTE — ED Notes (Signed)
 Pt transported to MRI

## 2023-12-29 NOTE — ED Provider Notes (Signed)
 [place] EMERGENCY DEPARTMENT AT [facility] Provider Note   CSN: [id number] Arrival date & time: 12/29/23  9098     Patient presents with: Left Eye Droop   Emily Reynolds is a 48 y.o. female.   48 year old female with past medical history of tobacco abuse and trigeminal neuralgia presenting to the emergency department today with concern for left-sided ptosis.  The patient states that she noticed this on the eighth.  States that she was fine when she woke up and when she was driving to work she has some watering in her right eye which is not uncommon.  She looked in the mirror and noticed that she had some ptosis on the left.  This lasted for about an hour.  She did follow-up with her primary care provider.  They were trying to order a CT scan as an outpatient and this was denied by her insurance.  She was referred to the ER at that time for further evaluation.  She was evaluated yesterday and left prior to evaluation by the emergency physician.        Prior to Admission medications   Medication Sig Start Date End Date Taking? Authorizing Provider  aspirin  81 MG chewable tablet Chew 1 tablet (81 mg total) by mouth daily. 12/29/23  Yes Ula Prentice SAUNDERS, MD  furosemide (LASIX) 20 MG tablet Take 20 mg by mouth.   Yes [provider]  Oxcarbazepine (TRILEPTAL) 300 MG tablet Take 300 mg by mouth 2 (two) times daily.   Yes [provider]  Tapinarof (VTAMA) 1 % CREA Apply topically.   Yes [provider]  ALPRAZolam (XANAX) 0.5 MG tablet Take 0.5 mg by mouth 2 (two) times daily as needed for anxiety.    [provider]  ibuprofen (ADVIL,MOTRIN) 200 MG tablet Take 200 mg by mouth as needed.    [provider]    Allergies: Keflex [cephalexin]    Review of Systems  Eyes:        Left-sided ptosis    Updated Vital Signs BP 122/80   Pulse 81   Temp 98.3 F (36.8 C) (Oral)   Resp 18   SpO2 100%   Physical Exam Vitals and nursing  note reviewed.   Gen: NAD Eyes: PERRL, EOMI HEENT: no oropharyngeal swelling Neck: trachea midline Resp: clear to auscultation bilaterally Card: RRR, no murmurs, rubs, or gallops Abd: nontender, nondistended Extremities: no calf tenderness, no edema Vascular: 2+ radial pulses bilaterally, 2+ DP pulses bilaterally Neuro: NIH stroke scale of 0 Skin: no rashes Psyc: acting appropriately   (all labs ordered are listed, but only abnormal results are displayed) Labs Reviewed  HCG, QUANTITATIVE, PREGNANCY - Abnormal; Notable for the following components:      Result Value   hCG, Beta Chain, Quant, S 12 (*)    All other components within normal limits  I-STAT VENOUS BLOOD GAS, ED - Abnormal; Notable for the following components:   pCO2, Ven 39.7 (*)    pO2, Ven 27 (*)    Calcium, Ion 1.10 (*)    All other components within normal limits  CBC    EKG: None  Radiology: CT ANGIO HEAD NECK W WO CM Result Date: 12/29/2023 CLINICAL DATA:  Neuro deficit, acute, stroke suspected EXAM: CT ANGIOGRAPHY HEAD AND NECK WITH AND WITHOUT CONTRAST TECHNIQUE: Multidetector CT imaging of the head and neck was performed using the standard protocol during bolus administration of intravenous contrast. Multiplanar CT image reconstructions and MIPs were obtained  to evaluate the vascular anatomy. Carotid stenosis measurements (when applicable) are obtained utilizing NASCET criteria, using the distal internal carotid diameter as the denominator. RADIATION DOSE REDUCTION: This exam was performed according to the departmental dose-optimization program which includes automated exposure control, adjustment of the mA and/or kV according to patient size and/or use of iterative reconstruction technique. CONTRAST:  75mL OMNIPAQUE  IOHEXOL  350 MG/ML SOLN COMPARISON:  MRI of the head dated December 29, 2023. FINDINGS: CT HEAD FINDINGS Brain: Normal brain. No evidence of hemorrhage, mass, cortical infarct or hydrocephalus.  Senescent calcifications within the globus pallidus bilaterally. No evidence of hemorrhage, mass, cortical infarct or hydrocephalus. Vascular: Mild calcific atheromatous disease within the carotid siphons. Skull: Intact and unremarkable. Sinuses/Orbits: Clear paranasal sinuses.  Normal orbits. Other: None. Review of the MIP images confirms the above findings CTA NECK FINDINGS Aortic arch: Minimal calcific plaque. Normal caliber. Standard 3 vessel takeoff of the great arteries, which are widely patent. Right carotid system: The common carotid and internal carotid arteries are normal in caliber and unremarkable throughout the neck. Left carotid system: The common carotid and internal carotid arteries are normal in caliber and unremarkable throughout the neck. Vertebral arteries: The vertebral arteries are codominant and normal in caliber throughout the neck. Skeleton: Negative. Other neck: Small subcentimeter cysts and nodules within the thyroid bilaterally. Upper chest: Mild emphysema within the lung apices. 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 There is also fusiform dilatation of the supraclinoid segment of the left internal carotid artery, which measures up to of 4.2 mm in diameter. The anterior and middle cerebral arteries and their proximal branches are normal in caliber and demonstrate no evidence of aneurysm or flow-limiting stenosis. Posterior circulation: The vertebrobasilar system is unremarkable. The posterior cerebral arteries and cerebellar arteries appear normal in caliber without evidence of aneurysm or flow-limiting stenosis. Venous sinuses: Patent. Anatomic variants: None. Review of the MIP images confirms the above findings IMPRESSION: 1. 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 2. 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 3. Normal CT angiogram of the neck. 4. Normal brain. These results were called by telephone at the time of interpretation on 12/29/2023 at 2:17 pm to provider Nickolai Rinks , who verbally acknowledged these results. Electronically Signed   By: Evalene Coho M.D.   On: 12/29/2023 14:19   CT ABDOMEN PELVIS W CONTRAST Result Date: 12/29/2023 CLINICAL DATA:  Abdominal pain, acute, nonlocalized + HCG, not sexually active, eval for malignancy EXAM: CT ABDOMEN AND PELVIS WITH CONTRAST TECHNIQUE: Multidetector CT imaging of the abdomen and pelvis was performed using the standard protocol following bolus administration of intravenous contrast. RADIATION DOSE REDUCTION: This exam was performed according to the departmental dose-optimization program which includes automated exposure control, adjustment of the mA and/or kV according to patient size and/or use of iterative reconstruction technique. CONTRAST:  75mL OMNIPAQUE  IOHEXOL  350 MG/ML SOLN COMPARISON:  None available. FINDINGS: Lower chest: No focal airspace consolidation or pleural effusion.5 mm right lateral lower lobe nodule (axial 38). 4 mm lateral left lower lobe nodule (axial 33). 2 mm left lower lobe nodule posteriorly (axial 9). 3 mm right middle lobe nodule (axial  9). Hepatobiliary: Multiple small cysts in the right hepatic lobe.Multiple small radiopaque gallstones. No wall thickening. No intrahepatic or extrahepatic biliary ductal dilation. The portal veins are patent. Pancreas: No mass or main ductal dilation. No peripancreatic inflammation or fluid collection. Spleen: Normal size. No mass. Adrenals/Urinary Tract: No adrenal masses. No renal mass. No nephrolithiasis or hydronephrosis. Partially distended urinary bladder without visualized abnormality. Stomach/Bowel: The  stomach contains ingested material without focal abnormality. No small bowel wall thickening or inflammation. No small bowel obstruction.Normal appendix. Scattered sigmoid colonic diverticulosis. No changes of acute diverticulitis. Vascular/Lymphatic: No aortic aneurysm. No intraabdominal or pelvic lymphadenopathy. Reproductive: The uterus and ovaries are within normal limits for patient's age. Well-positioned intrauterine device in the upper endometrium.No free pelvic fluid. Other: No pneumoperitoneum, ascites, or mesenteric inflammation. Musculoskeletal: No acute fracture or destructive lesion. Moderate degenerative disc disease of L5-S1. IMPRESSION: 1. No acute intra-abdominal or pelvic abnormality. 2. Scattered sigmoid diverticulosis. No changes of acute diverticulitis. 3. Multiple small lung nodules in the visualized lung bases, measuring up to 5 mm in the right lower lobe. No follow-up needed if patient is low-risk (and has no known or suspected primary neoplasm). Non-contrast chest CT can be considered in 12 months if patient is high-risk. This recommendation follows the consensus statement: Guidelines for Management of Incidental Pulmonary Nodules Detected on CT Images: From the Fleischner Society 2017; Radiology 2017; 284:228-243. 4. Cholecystolithiasis. Electronically Signed   By: Rogelia Myers M.D.   On: 12/29/2023 13:28   MR BRAIN WO CONTRAST Result Date: 12/29/2023 CLINICAL DATA:  Provided history: Neuro deficit, acute, stroke suspected. EXAM: MRI HEAD WITHOUT CONTRAST TECHNIQUE: Multiplanar, multiecho pulse sequences of the brain and surrounding structures were obtained without intravenous contrast. COMPARISON:  Head CT 12/28/2023.  Brain MRI 08/05/2020. FINDINGS: Brain: Cerebral volume is normal. Multifocal T2 FLAIR hyperintense signal abnormality within the cerebral white matter, overall mild but greater than expected for age. No cortical encephalomalacia is identified. There is no acute  infarct. No evidence of an intracranial mass. No chronic intracranial blood products. No extra-axial fluid collection. No midline shift. Vascular: Maintained flow voids within the proximal large arterial vessels. Skull and upper cervical spine: No focal worrisome marrow lesion. Incompletely assessed cervical spondylosis. Sinuses/Orbits: No mass or acute finding within the imaged orbits. No significant paranasal sinus disease. IMPRESSION: 1.  No evidence of an acute intracranial abnormality. 2. Multifocal T2 FLAIR hyperintense signal abnormality within the cerebral white matter, greater than expected for age. Findings are nonspecific, but most often secondary to chronic small vessel ischemia. Electronically Signed   By: Rockey Childs D.O.   On: 12/29/2023 13:01   CT Head Wo Contrast Result Date: 12/28/2023 CLINICAL DATA:  Neuro deficit, acute, stroke suspected EXAM: CT HEAD WITHOUT CONTRAST TECHNIQUE: Contiguous axial images were obtained from the base of the skull through the vertex without intravenous contrast. RADIATION DOSE REDUCTION: This exam was performed according to the departmental dose-optimization program which includes automated exposure control, adjustment of the mA and/or kV according to patient size and/or use of iterative reconstruction technique. COMPARISON:  None Available. FINDINGS: Brain: Normal brain. No evidence of hemorrhage, mass, cortical infarct or hydrocephalus. Vascular: Mild atheromatous calcifications. Skull: Intact and unremarkable. Sinuses/Orbits: Clear paranasal sinuses.  Normal orbits. Other: None. IMPRESSION: Normal. Electronically Signed   By: Evalene Coho M.D.   On: 12/28/2023 18:24     Procedures   Medications Ordered in the ED  iohexol  (OMNIPAQUE ) 350 MG/ML injection 75 mL (75 mLs Intravenous Contrast Given 12/29/23 1233)  aspirin  chewable tablet 81 mg (81 mg Oral Given 12/29/23 1511)                                    Medical Decision Making 48 year old  female with past medical history of tobacco abuse and trigeminal neuralgia presenting to the emergency department today with left-sided ptosis that has since resolved.  The patient is back to her baseline.  She did have a positive hCG on her qualitative test.  Will further evaluate her here with a quantitative hCG to evaluate for pregnancy versus underlying malignancy as this would make her higher risk.  I did call and discussed her case with Dr. Jerrie from neurology.  She recommends CT angiogram and MRI.  If the patient's workup is totally reassuring and she is not having any palpitations or cardiac symptoms she felt that she could follow-up as an outpatient but if she is having any palpitations or if her hCG is positive she may require admission for the full stroke workup including echocardiogram.  The patient's hCG is mildly elevated here.  CT abdomen is ordered to evaluate for possible malignancy given this finding as this could potentially make her hypercoagulable.  This was negative.  CT angiogram showed a small aneurysm.  MRI showed some microischemic changes.  I did discuss this with Dr. Jerrie who recommends outpatient follow-up and aspirin .  The patient be referred to OB/GYN for further evaluation for the elevated hCG as well.  She is counseled on smoking cessation.  She is discharged and encouraged follow-up with her primary care provider regarding the pulmonary nodule as well.  Amount and/or Complexity of Data Reviewed Labs: ordered. Radiology: ordered.  Risk OTC drugs. Prescription drug management.        Final diagnoses:  Drooping eyelid, left  Pulmonary nodule  Elevated serum hCG  Cerebral aneurysm    ED Discharge Orders          Ordered    aspirin  81 MG chewable tablet  Daily        12/29/23 1517    Ambulatory referral to Neurology       Comments: An appointment is requested in approximately: 2 weeks   12/29/23 1522               Ula Prentice SAUNDERS, MD 12/29/23  1524

## 2023-12-29 NOTE — Discharge Instructions (Addendum)
 Please start the baby aspirin  daily.  Please call and schedule a follow-up appointment with your primary care doctor.  You will need a repeat CT scan in 12 months for the pulmonary nodule seen.  Please follow-up with [facility] neurology.  I have placed a referral so you should receive a call in the next few days regarding follow-up for the aneurysm which will need to be followed.  Your primary care doctor will also want to keep a close eye on your blood sugars and cholesterol.  For the elevated hCG [facility] at [facility] provided.  Return to the emergency department for worsening symptoms.

## 2023-12-29 NOTE — ED Triage Notes (Addendum)
 12/27/23 pt was driving to work in the morning and noticed that her left eye was drooping. Upon noticing this she went to see her PCP, whom advised her to go to the ED. Pt came to the ED last night and received a work up but left AMA because she was feeling uncertain about the quality of care after the PA telling her that her pregnancy test was positive. Pt states she has an IUD and has not had sexual intercourse since 2023. Pt is concerned something else is causing the pregnancy test to come back positive and is still concerned about the eye droop.

## 2023-12-29 NOTE — ED Notes (Signed)
 RN- Allision B was provided a copy of istat VBG.

## 2023-12-30 DIAGNOSIS — F411 Generalized anxiety disorder: Secondary | ICD-10-CM | POA: Insufficient documentation

## 2023-12-30 DIAGNOSIS — Z8669 Personal history of other diseases of the nervous system and sense organs: Secondary | ICD-10-CM | POA: Insufficient documentation

## 2023-12-30 DIAGNOSIS — Z8719 Personal history of other diseases of the digestive system: Secondary | ICD-10-CM | POA: Insufficient documentation

## 2023-12-30 DIAGNOSIS — I671 Cerebral aneurysm, nonruptured: Secondary | ICD-10-CM | POA: Insufficient documentation

## 2023-12-30 DIAGNOSIS — D1720 Benign lipomatous neoplasm of skin and subcutaneous tissue of unspecified limb: Secondary | ICD-10-CM | POA: Insufficient documentation

## 2024-01-02 DIAGNOSIS — Z124 Encounter for screening for malignant neoplasm of cervix: Secondary | ICD-10-CM | POA: Diagnosis not present

## 2024-01-02 DIAGNOSIS — Z6832 Body mass index (BMI) 32.0-32.9, adult: Secondary | ICD-10-CM | POA: Diagnosis not present

## 2024-01-02 DIAGNOSIS — Z01419 Encounter for gynecological examination (general) (routine) without abnormal findings: Secondary | ICD-10-CM | POA: Diagnosis not present

## 2024-01-02 DIAGNOSIS — N912 Amenorrhea, unspecified: Secondary | ICD-10-CM | POA: Diagnosis not present

## 2024-01-09 DIAGNOSIS — Z1231 Encounter for screening mammogram for malignant neoplasm of breast: Secondary | ICD-10-CM | POA: Diagnosis not present

## 2024-01-10 ENCOUNTER — Telehealth: Payer: Self-pay | Admitting: Neurology

## 2024-01-10 ENCOUNTER — Encounter: Payer: Self-pay | Admitting: Neurology

## 2024-01-10 ENCOUNTER — Ambulatory Visit (INDEPENDENT_AMBULATORY_CARE_PROVIDER_SITE_OTHER): Admitting: Neurology

## 2024-01-10 VITALS — BP 124/81 | HR 78 | Ht 65.0 in | Wt 203.0 lb

## 2024-01-10 DIAGNOSIS — I671 Cerebral aneurysm, nonruptured: Secondary | ICD-10-CM

## 2024-01-10 DIAGNOSIS — H02402 Unspecified ptosis of left eyelid: Secondary | ICD-10-CM

## 2024-01-10 DIAGNOSIS — G500 Trigeminal neuralgia: Secondary | ICD-10-CM | POA: Diagnosis not present

## 2024-01-10 NOTE — Telephone Encounter (Signed)
 Referral for Neurosurgery sent thru epic to [facility]

## 2024-01-10 NOTE — Addendum Note (Signed)
 Addended by: DELFINO AUGUSTIN BROCKS on: 01/10/2024 09:03 AM   Modules accepted: Orders

## 2024-01-10 NOTE — Patient Instructions (Signed)
 I had a long discussion with the patient and her friend at the regarding her transient episode of left eye drooping which remains of unclear etiology and reviewed results of CT angiogram and MRI scan and answered questions.  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  She does not have any significant risk factors for aneurysms history of remote smoking.  I have counseled her to stay off cigarettes and vaping and maintain strict blood pressure control systolic goal below 140/90.  Recommend likely conservative follow-up for aneurysm but she will benefit with referral to endovascular neurosurgeon Dr. Janjua for his opinion as well.  She also has remote history of trigeminal neuralgia which appears quite well-controlled and she can continue Trileptal as needed for that.  Check ANA panel, Lyme titer and hemoglobin A1c.  Return for follow-up in the future in 6 months or call earlier if necessary.  Cerebral Aneurysm  A cerebral aneurysm is a bulge in a blood vessel (artery) in the brain. This happens when a weak part of a blood vessel expands due to the constant pressure of blood flowing through the blood vessel. As the aneurysm expands, the walls of the blood vessel become weaker. Aneurysms are dangerous because they can leak or burst (rupture). When a cerebral aneurysm ruptures, it causes bleeding in the brain (hemorrhage). The blood flow to the area of the brain supplied by the artery is also reduced. This is a kind of stroke. A ruptured cerebral aneurysm is a medical emergency. This can cause permanent brain damage or death. What are the causes? The exact cause of this condition is not known. What increases the risk? Your health care provider will talk with you about risks. These may include: Having high blood pressure (hypertension). Smoking. Being older. The condition is most common in people between the ages of 35 and 101. Being  female. Having a direct family history of aneurysm. Having certain blood vessel disorders, such as: Fibromuscular dysplasia. Cerebral arteritis. Arterial dissection. Infections. Use of recreational drugs. Brain trauma. What are the signs or symptoms? The symptoms of a cerebral aneurysm that has not leaked or burst can depend on its size, shape, location, and rate of growth. A smaller, unchanging aneurysm does not usually cause symptoms. A larger, growing aneurysm can increase pressure on the brain or nerves. This increased pressure can cause: A headache. Vision problems. Numbness or weakness in an arm or leg. Memory problems. Problems speaking. Seizures. If an aneurysm leaks or bursts, it can cause a condition called a hemorrhagic stroke. This can be fatal. Symptoms may include: A sudden, severe headache with no known cause. This is often described as the worst headache ever experienced. Stiff neck. Nausea or vomiting, especially when combined with other symptoms, such as a headache. Sudden weakness or numbness of the face, arm, or leg, especially on one side of the body. You may notice a drooping of one side of the face. Sudden trouble walking or difficulty moving your arms or legs. Double vision or sudden trouble seeing in one or both eyes. Trouble speaking or understanding speech. Trouble swallowing. Dizziness. Loss of balance or coordination. Needing to stay away from light. Sudden confusion or loss of consciousness. How is this diagnosed? This condition is diagnosed using certain tests, including: CT scan. Computed tomographic angiogram (CTA). This test uses a dye and a scanner to produce images of your blood vessels. Magnetic resonance angiogram (MRA). This test uses an  MRI machine to produce images of your blood vessels. Diagnostic cerebral angiogram (DSA). This test involves placing a soft tube (catheter) into the artery in your thigh and guiding it up to the arteries in  the brain. A dye is then injected and X-rays are taken to create images of your blood vessels. How is this treated? Unruptured aneurysm Treatment for an aneurysm that is not causing problems will depend on many factors. These may be the size and location of the aneurysm, your age, your overall health, and your preferences. Small aneurysms in certain locations of the brain have a very low chance of bleeding or rupturing. These small aneurysms may not need to be treated. For these, your provider may monitor the aneurysm regularly to check for any changes. In some cases, treatment may be required. Treatment options may include: Coiling. During this procedure, a catheter is inserted and moved through a blood vessel. Once the catheter reaches the aneurysm, tiny coils are used to block blood flow into the aneurysm. This procedure is sometimes done at the same time as a DSA. Surgical clipping. During surgery, a clip is placed at the base of the aneurysm. The clip prevents blood from entering the aneurysm. Flow diversion. This procedure is used to divert blood flow around the aneurysm with a stent that is placed across the opening of an aneurysm. Ruptured aneurysm For a ruptured aneurysm, emergency surgery or coiling is often needed right away to help reduce damage to the brain and to reduce the risk of more bleeding. Follow these instructions at home: If your aneurysm is not treated: Take over-the-counter and prescription medicines only as told by your provider. Follow a diet suggested by your provider. Diet changes may be advised to address hypertension, such as choosing foods that are low in salt (sodium), saturated fat, trans fat, and cholesterol. Talk to your provider about what activities are safe for you. Talk to your provider about ways to lower stress with meditation or yoga. Do not use any products that contain nicotine or tobacco before the procedure. These products include cigarettes, chewing  tobacco, and vaping devices, such as e-cigarettes. If you need help quitting, ask your provider. If you drink alcohol: Limit how much you have to: 0-1 drink a day if you are female. 0-2 drinks a day if you are female. Know how much alcohol is in your drink. In the [place] one drink is one 12 oz bottle of beer (355 mL), one 5 oz glass of wine (148 mL), or one 1 oz glass of hard liquor (44 mL). Do not use drugs. If you need help quitting, ask your provider. Keep all follow-up visits. This includes any referrals, imaging studies, and lab tests. Proper follow-up may prevent an aneurysm rupture or a stroke. Get help right away if: You have a sudden, severe headache with no known cause. This may include a stiff neck. You have sudden nausea or vomiting with a severe headache. You have a seizure. You have other symptoms of a stroke. BE FAST is an easy way to remember the main warning signs of a stroke: B - Balance. Signs are dizziness, sudden trouble walking, or loss of balance. E - Eyes. Signs are trouble seeing or a sudden change in vision. F - Face. Signs are sudden weakness or numbness of the face, or the face or eyelid drooping on one side. A - Arms. Signs are weakness or numbness in an arm. This happens suddenly and usually on one side of the  body. S - Speech. Signs are sudden trouble speaking, slurred speech, or trouble understanding what people say. T - Time. Time to call emergency services. Write down what time symptoms started. These symptoms may be an emergency. Get help right away. Call 911. Do not wait to see if the symptoms will go away. Do not drive yourself to the hospital. This information is not intended to replace advice given to you by your health care provider. Make sure you discuss any questions you have with your health care provider. Document Revised: 04/26/2022 Document Reviewed: 04/26/2022 Elsevier Patient Education  2024 [employer].

## 2024-01-10 NOTE — Progress Notes (Signed)
 [facility] [address] [place]. [place] [address] [phone]       OFFICE CONSULT NOTE  Emily Reynolds Date of Birth:  Sep 11, 1975 Medical Record Number:  [id number]   Referring MD:  Prentice Medicus  Reason for Referral: Drooping eyelid  HPI: Emily Reynolds is a 48 year old Caucasian lady seen today for initial office consultation visit.  She accompanied by a friend Research scientist (physical sciences).  History is obtained from them and review of electronic medical records.  I personally reviewed pertinent available imaging films in PACS.  She has no significant past medical history except gastroesophageal reflux disease.  Patient states that she was driving to work on 2/89/7974 when she developed sudden onset of what appeared to be drooping of the left eyelid.  She has history of watering of her eyelids and as she was never looked in the back view mirror she noticed that the left eye appeared lower than the right.  She denied any accompanying headache pain in the eye or redness.  This lasted about an hour and recovered by the time she reached work.  Patient states this has not happened since then.  She denies any history of diplopia, extremity weakness.  She does complain of tiredness that she has had but she was diagnosed with low vitamin B12 by her primary care physician and replacement of this seems to have helped.  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  MRI scan of the brain done the same day shows nonspecific T2/FLAIR white matter hyperintensities in the supratentorial region.  Lab work on 11/07/2023 shows LDL cholesterol 140 mg percent.  TSH, complaints of metabolic panel and CBC were normal.  The patient does give history of tick bite with the last tick bite being below her left breast about a month ago.  She denies any typical rash  to suggest Lyme disease or arthralgias.  She does have history of smoking less than 1 pack/day for 30 years but quit 2 years ago.  She was doing vaping so she stopped this since having the eye drooping episode.  She has no family history of intracerebral hemorrhage or cerebral aneurysm. She has history of transient intermittent left facial paresthesias and is diagnosed with trigeminal neuralgia by Dr.Shawn Ezra Reynolds and underwent MRI scan on the brain and facial nerve protocol on 08/05/2020 which showed a vascular loop abutting the left trigeminal nerve near the root entry zone.  She has since been taking Trileptal 300 mg as needed instead of twice daily which she was prescribed in is doing well without significant recurrent paresthesias. ROS:   14 system review of systems is positive for drooping eyelid, tiredness, anxiety, tick bite all other systems negative  PMH:  Past Medical History:  Diagnosis Date   History of gastroesophageal reflux (GERD)     Social History:  Social History   Socioeconomic History   Marital status: Married    Spouse name: Not on file   Number of children: Not on file   Years of education: Not on file   Highest education level: Not on file  Occupational History   Not on file  Tobacco Use   Smoking status: Every Day    Current packs/day: 1.00    Types: Cigarettes   Smokeless tobacco: Not on file  Vaping Use   Vaping  status: Every Day   Last attempt to quit: 12/29/2023  Substance and Sexual Activity   Alcohol use: No   Drug use: No   Sexual activity: Not on file  Other Topics Concern   Not on file  Social History Narrative   Not on file   Social Drivers of Health   Financial Resource Strain: Not on file  Food Insecurity: Not on file  Transportation Needs: Not on file  Physical Activity: Not on file  Stress: Not on file  Social Connections: Not on file  Intimate Partner Violence: Not on file    Medications:   Current Outpatient Medications  on File Prior to Visit  Medication Sig Dispense Refill   aspirin  81 MG chewable tablet Chew 1 tablet (81 mg total) by mouth daily. 90 tablet 1   furosemide (LASIX) 20 MG tablet Take 20 mg by mouth.     ibuprofen (ADVIL,MOTRIN) 200 MG tablet Take 200 mg by mouth as needed.     Oxcarbazepine (TRILEPTAL) 300 MG tablet Take 300 mg by mouth 2 (two) times daily.     Tapinarof (VTAMA) 1 % CREA Apply topically.     No current facility-administered medications on file prior to visit.    Allergies:   Allergies  Allergen Reactions   Keflex [Cephalexin]     CAUSES BAD HEADACHES    Physical Exam General: well developed, well nourished, seated, in no evident distress Head: head normocephalic and atraumatic.   Neck: supple with no carotid or supraclavicular bruits Cardiovascular: regular rate and rhythm, no murmurs Musculoskeletal: no deformity Skin:  no rash/petichiae Vascular:  Normal pulses all extremities  Neurologic Exam Mental Status: Awake and fully alert. Oriented to place and time. Recent and remote memory intact. Attention span, concentration and fund of knowledge appropriate. Mood and affect appropriate.  Cranial Nerves: Fundoscopic exam reveals sharp disc margins. Pupils equal, briskly reactive to light. Extraocular movements full without nystagmus. Visual fields full to confrontation. Hearing intact. Facial sensation intact. Face, tongue, palate moves normally and symmetrically.  Motor: Normal bulk and tone. Normal strength in all tested extremity muscles. Sensory.: intact to touch , pinprick , position and vibratory sensation.  Coordination: Rapid alternating movements normal in all extremities. Finger-to-nose and heel-to-shin performed accurately bilaterally. Gait and Station: Arises from chair without difficulty. Stance is normal. Gait demonstrates normal stride length and balance . Able to heel, toe and tandem walk without difficulty.  Reflexes: 1+ and symmetric. Toes downgoing.    NIHSS  0 Modified Rankin  0   ASSESSMENT: 48 year old Caucasian lady with transient episode of left eye partial ptosis of unclear etiology.  Remote history of trigeminal neuralgia with a vascular loop on trigeminal nerve which appears quite well-controlled.  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     PLAN:I had a long discussion with the patient and her friend at the regarding her transient episode of left eye drooping which remains of unclear etiology and reviewed results of CT angiogram and MRI scan and answered questions.  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  She does not have any significant risk factors for aneurysms history of remote smoking.  I have counseled her to stay off cigarettes and vaping and maintain strict blood pressure control systolic goal below 140/90.  Recommend likely conservative follow-up for aneurysm but she will benefit with referral to endovascular neurosurgeon Dr. Janjua for his opinion as well.  She also has remote history of trigeminal neuralgia which appears quite well-controlled and she can continue Trileptal as needed for that.  Check ANA panel, Lyme titer and hemoglobin A1c.  Continue Trileptal as needed for her trigeminal neuralgia which appears to be well-controlled.  Return for follow-up in the future in 6 months or call earlier if necessary.   I personally spent a total of 60 minutes in the care of the patient today including getting/reviewing separately obtained history, performing a medically appropriate exam/evaluation, counseling and educating, placing orders, referring and communicating with other health care professionals, documenting clinical information in the EHR, independently interpreting results, and coordinating care.        Eather Popp, MD Note: This document was prepared with digital dictation and possible smart phrase  technology. Any transcriptional errors that result from this process are unintentional.

## 2024-01-11 ENCOUNTER — Ambulatory Visit: Payer: Self-pay | Admitting: Neurology

## 2024-01-11 LAB — HEMOGLOBIN A1C
Est. average glucose Bld gHb Est-mCnc: 97 mg/dL
Hgb A1c MFr Bld: 5 % (ref 4.8–5.6)

## 2024-01-11 LAB — ANA COMPREHENSIVE PANEL
Anti JO-1: <0.2 AI (ref 0.0–0.9)
Centromere Ab Screen: <0.2 AI (ref 0.0–0.9)
Chromatin Ab SerPl-aCnc: <0.2 AI (ref 0.0–0.9)
ENA RNP Ab: <0.2 AI (ref 0.0–0.9)
ENA SM Ab Ser-aCnc: <0.2 AI (ref 0.0–0.9)
ENA SSA (RO) Ab: <0.2 AI (ref 0.0–0.9)
ENA SSB (LA) Ab: <0.2 AI (ref 0.0–0.9)
Scleroderma (Scl-70) (ENA) Antibody, IgG: <0.2 AI (ref 0.0–0.9)
dsDNA Ab: <1 [IU]/mL (ref 0–9)

## 2024-01-11 LAB — LYME DISEASE SEROLOGY W/REFLEX: Lyme Total Antibody EIA: NEGATIVE

## 2024-01-12 DIAGNOSIS — Z30432 Encounter for removal of intrauterine contraceptive device: Secondary | ICD-10-CM | POA: Diagnosis not present

## 2024-01-12 DIAGNOSIS — R891 Abnormal level of hormones in specimens from other organs, systems and tissues: Secondary | ICD-10-CM | POA: Diagnosis not present

## 2024-01-17 ENCOUNTER — Other Ambulatory Visit: Payer: Self-pay | Admitting: Obstetrics and Gynecology

## 2024-01-17 ENCOUNTER — Encounter: Payer: Self-pay | Admitting: Neurology

## 2024-01-17 DIAGNOSIS — R928 Other abnormal and inconclusive findings on diagnostic imaging of breast: Secondary | ICD-10-CM

## 2024-01-18 ENCOUNTER — Inpatient Hospital Stay
Admission: RE | Admit: 2024-01-18 | Discharge: 2024-01-18 | Discharge status: Home / Self Care | Source: Ambulatory Visit | Attending: Obstetrics and Gynecology | Admitting: Obstetrics and Gynecology

## 2024-01-18 DIAGNOSIS — R928 Other abnormal and inconclusive findings on diagnostic imaging of breast: Secondary | ICD-10-CM

## 2024-01-18 DIAGNOSIS — N6001 Solitary cyst of right breast: Secondary | ICD-10-CM | POA: Diagnosis not present

## 2024-01-27 DIAGNOSIS — R891 Abnormal level of hormones in specimens from other organs, systems and tissues: Secondary | ICD-10-CM | POA: Diagnosis not present

## 2024-01-31 ENCOUNTER — Encounter: Payer: Self-pay | Admitting: Neurosurgery

## 2024-01-31 ENCOUNTER — Ambulatory Visit (INDEPENDENT_AMBULATORY_CARE_PROVIDER_SITE_OTHER): Admitting: Neurosurgery

## 2024-01-31 VITALS — BP 141/82 | HR 80 | Ht 65.0 in | Wt 206.0 lb

## 2024-01-31 DIAGNOSIS — I671 Cerebral aneurysm, nonruptured: Secondary | ICD-10-CM

## 2024-01-31 NOTE — Progress Notes (Signed)
 Assessment : 48 year old lady who about 2 years ago started noticing left-sided facial tingling and the suspicion of trigeminal neuralgia was started on oxcarbazepine.  She is taking 2 tablets twice a day and with that symptoms are controlled.  She has been doing very well however recently she was driving in her car and looked in the rearview mirror and noticed that her left eyelid was slightly drooping.  Patient got up to work where she looked in the mirror again and this was gone.  She got in touch with her primary care and was recommended that she gets a CT scan and this was resisted by the insurance company.  Patient ended up in the emergency room where a CT was done as well as an MRI and patient decided not to wait and went home.  The next day upon urging of healthcare providers, she was recommended to go back where further workup was done and patient was admitted to the hospital.  Here for workup was done and no clear substrate for her left-sided eye drooping was found.  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  Patient was referred to us .  Patient does not endorse any diplopia and has not had any visual problems.  She has not had any drooping of her eyelid since that moment.  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  Plan : I reviewed the imaging with her and her friend and showed her this aneurysm and I went over the natural history of aneurysms.  I shared with her that in order to get a better understanding of this aneurysm, I recommend a diagnostic angiogram with 3D imaging and then we will be able to talk about the options that are available to her.  I told her about the risks of angiography and she decided to proceed.  From my standpoint she can have hormone replacement therapy as per the  discretion of her treating team.  I do not have a clear explanation for why she had elevated hCG and do not believe that this was related to her aneurysm.   Social History   Socioeconomic History   Marital status: Married    Spouse name: Not on file   Number of children: Not on file   Years of education: Not on file   Highest education level: Not on file  Occupational History   Not on file  Tobacco Use   Smoking status: Former    Current packs/day: 1.00    Types: Cigarettes   Smokeless tobacco: Not on file  Vaping Use   Vaping status: Former   Quit date: 12/29/2023  Substance and Sexual Activity   Alcohol use: No   Drug use: No   Sexual activity: Not on file  Other Topics Concern   Not on file  Social History Narrative   Not on file   Social Drivers of Health   Financial Resource Strain: Not on file  Food Insecurity: Not on file  Transportation Needs: Not on file  Physical Activity: Not on file  Stress: Not on file  Social Connections: Not on file  Intimate Partner Violence: Not on file    Family History  Problem Relation Age of Onset   Lung cancer Father    Diabetes Mother     Allergies  Allergen Reactions   Cephalexin Rash  CAUSES BAD HEADACHES  Keflex    Past Medical History:  Diagnosis Date   History of gastroesophageal reflux (GERD)     History reviewed. No pertinent surgical history.   Physical Exam HENT:     Head: Normocephalic.     Nose: Nose normal.  Eyes:     Pupils: Pupils are equal, round, and reactive to light.  Cardiovascular:     Rate and Rhythm: Normal rate.  Pulmonary:     Effort: Pulmonary effort is normal.  Abdominal:     General: Abdomen is flat.  Musculoskeletal:     Cervical back: Normal range of motion.  Neurological:     Mental Status: She is alert.     Cranial Nerves: Cranial nerves 2-12 are intact.     Sensory: Sensation is intact.     Motor: Motor function is intact.     Coordination: Coordination is intact.         Results for orders placed or performed during the hospital encounter of 12/29/23  MR BRAIN WO CONTRAST   Narrative   CLINICAL DATA:  Provided history: Neuro deficit, acute, stroke suspected.  EXAM: MRI HEAD WITHOUT CONTRAST  TECHNIQUE: Multiplanar, multiecho pulse sequences of the brain and surrounding structures were obtained without intravenous contrast.  COMPARISON:  Head CT 12/28/2023.  Brain MRI 08/05/2020.  FINDINGS: Brain:  Cerebral volume is normal.  Multifocal T2 FLAIR hyperintense signal abnormality within the cerebral white matter, overall mild but greater than expected for age.  No cortical encephalomalacia is identified.  There is no acute infarct.  No evidence of an intracranial mass.  No chronic intracranial blood products.  No extra-axial fluid collection.  No midline shift.  Vascular: Maintained flow voids within the proximal large arterial vessels.  Skull and upper cervical spine: No focal worrisome marrow lesion. Incompletely assessed cervical spondylosis.  Sinuses/Orbits: No mass or acute finding within the imaged orbits. No significant paranasal sinus disease.  IMPRESSION: 1.  No evidence of an acute intracranial abnormality. 2. Multifocal T2 FLAIR hyperintense signal abnormality within the cerebral white matter, greater than expected for age. Findings are nonspecific, but most often secondary to chronic small vessel ischemia.   Electronically Signed   By: Rockey Childs D.O.   On: 12/29/2023 13:01   Results for orders placed or performed during the hospital encounter of 12/28/23  CT Head Wo Contrast   Narrative   CLINICAL DATA:  Neuro deficit, acute, stroke suspected  EXAM: CT HEAD WITHOUT CONTRAST  TECHNIQUE: Contiguous axial images were obtained from the base of the skull through the vertex without intravenous contrast.  RADIATION DOSE REDUCTION: This exam was performed according to the departmental  dose-optimization program which includes automated exposure control, adjustment of the mA and/or kV according to patient size and/or use of iterative reconstruction technique.  COMPARISON:  None Available.  FINDINGS: Brain: Normal brain. No evidence of hemorrhage, mass, cortical infarct or hydrocephalus.  Vascular: Mild atheromatous calcifications.  Skull: Intact and unremarkable.  Sinuses/Orbits: Clear paranasal sinuses.  Normal orbits.  Other: None.  IMPRESSION: Normal.   Electronically Signed   By: Evalene Coho M.D.   On: 12/28/2023 18:24

## 2024-02-01 ENCOUNTER — Encounter (HOSPITAL_COMMUNITY): Payer: Self-pay

## 2024-02-01 ENCOUNTER — Other Ambulatory Visit (HOSPITAL_COMMUNITY): Payer: Self-pay | Admitting: Neurosurgery

## 2024-02-01 DIAGNOSIS — I671 Cerebral aneurysm, nonruptured: Secondary | ICD-10-CM

## 2024-02-09 ENCOUNTER — Encounter: Payer: Self-pay | Admitting: Pediatrics

## 2024-02-17 DIAGNOSIS — R891 Abnormal level of hormones in specimens from other organs, systems and tissues: Secondary | ICD-10-CM | POA: Diagnosis not present

## 2024-02-22 DIAGNOSIS — Z13220 Encounter for screening for lipoid disorders: Secondary | ICD-10-CM | POA: Diagnosis not present

## 2024-02-22 DIAGNOSIS — I671 Cerebral aneurysm, nonruptured: Secondary | ICD-10-CM | POA: Diagnosis not present

## 2024-02-22 DIAGNOSIS — G500 Trigeminal neuralgia: Secondary | ICD-10-CM | POA: Diagnosis not present

## 2024-02-22 DIAGNOSIS — F411 Generalized anxiety disorder: Secondary | ICD-10-CM | POA: Diagnosis not present

## 2024-02-23 ENCOUNTER — Other Ambulatory Visit: Payer: Self-pay

## 2024-03-02 ENCOUNTER — Ambulatory Visit (HOSPITAL_COMMUNITY)
Admission: RE | Admit: 2024-03-02 | Discharge: 2024-03-02 | Disposition: A | Discharge status: Home / Self Care | Source: Ambulatory Visit | Attending: Neurosurgery | Admitting: Neurosurgery

## 2024-03-02 ENCOUNTER — Other Ambulatory Visit (HOSPITAL_COMMUNITY): Payer: Self-pay | Admitting: Neurosurgery

## 2024-03-02 ENCOUNTER — Other Ambulatory Visit: Payer: Self-pay

## 2024-03-02 DIAGNOSIS — I671 Cerebral aneurysm, nonruptured: Secondary | ICD-10-CM

## 2024-03-02 DIAGNOSIS — Z87891 Personal history of nicotine dependence: Secondary | ICD-10-CM | POA: Insufficient documentation

## 2024-03-02 HISTORY — PX: IR ANGIO INTRA EXTRACRAN SEL COM CAROTID INNOMINATE BILAT MOD SED: IMG5360

## 2024-03-02 HISTORY — PX: IR ANGIO VERTEBRAL SEL VERTEBRAL BILAT MOD SED: IMG5369

## 2024-03-02 HISTORY — PX: IR US GUIDE VASC ACCESS RIGHT: IMG2390

## 2024-03-02 MED ORDER — LIDOCAINE-EPINEPHRINE 1 %-1:100000 IJ SOLN
INTRAMUSCULAR | Status: AC
  Filled 2024-03-02: qty 1

## 2024-03-02 MED ORDER — FENTANYL CITRATE (PF) 100 MCG/2ML IJ SOLN
INTRAMUSCULAR | Status: AC
  Filled 2024-03-02: qty 2

## 2024-03-02 MED ORDER — LIDOCAINE HCL (PF) 1 % IJ SOLN
INTRAMUSCULAR | Status: AC
  Filled 2024-03-02: qty 30

## 2024-03-02 MED ORDER — NITROGLYCERIN 1 MG/10 ML FOR IR/CATH LAB
100.0000 ug | Freq: Once | INTRA_ARTERIAL | Status: AC
  Administered 2024-03-02: 200 ug via INTRA_ARTERIAL

## 2024-03-02 MED ORDER — LIDOCAINE HCL 1 % IJ SOLN
INTRAMUSCULAR | Status: AC
  Filled 2024-03-02: qty 20

## 2024-03-02 MED ORDER — VERAPAMIL HCL 2.5 MG/ML IV SOLN
INTRA_ARTERIAL | Status: AC | PRN
  Administered 2024-03-02: 5 mL via INTRA_ARTERIAL

## 2024-03-02 MED ORDER — IOHEXOL 300 MG/ML  SOLN
100.0000 mL | Freq: Once | INTRAMUSCULAR | Status: AC | PRN
  Administered 2024-03-02: 58 mL via INTRA_ARTERIAL

## 2024-03-02 MED ORDER — FENTANYL CITRATE (PF) 100 MCG/2ML IJ SOLN
INTRAMUSCULAR | Status: AC | PRN
  Administered 2024-03-02: 25 ug via INTRAVENOUS

## 2024-03-02 MED ORDER — HEPARIN SODIUM (PORCINE) 1000 UNIT/ML IJ SOLN
INTRAMUSCULAR | Status: AC
  Filled 2024-03-02: qty 10

## 2024-03-02 MED ORDER — VERAPAMIL HCL 2.5 MG/ML IV SOLN
INTRAVENOUS | Status: AC
  Filled 2024-03-02: qty 2

## 2024-03-02 MED ORDER — NITROGLYCERIN 1 MG/10 ML FOR IR/CATH LAB
INTRA_ARTERIAL | Status: AC
  Filled 2024-03-02: qty 10

## 2024-03-02 MED ORDER — HEPARIN SODIUM (PORCINE) 1000 UNIT/ML IJ SOLN
INTRAMUSCULAR | Status: AC | PRN
  Administered 2024-03-02: 5000 [IU] via INTRAVENOUS

## 2024-03-02 MED ORDER — LIDOCAINE HCL 1 % IJ SOLN
30.0000 mL | Freq: Once | INTRAMUSCULAR | Status: AC
  Administered 2024-03-02: 10 mL

## 2024-03-02 MED ORDER — MIDAZOLAM HCL 2 MG/2ML IJ SOLN
INTRAMUSCULAR | Status: AC
  Filled 2024-03-02: qty 2

## 2024-03-02 MED ORDER — MIDAZOLAM HCL 2 MG/2ML IJ SOLN
INTRAMUSCULAR | Status: AC | PRN
  Administered 2024-03-02 (×2): 1 mg via INTRAVENOUS

## 2024-03-02 NOTE — Addendum Note (Signed)
 Addended by: Braiden Presutti on: 03/02/2024 08:11 AM   Modules accepted: Orders

## 2024-03-02 NOTE — H&P (Signed)
 48yo lady with a RIGHT Posterior communicating artery aneurysm  Past Medical History:  Diagnosis Date   History of gastroesophageal reflux (GERD)    No past surgical history on file. Social History   Socioeconomic History   Marital status: Married    Spouse name: Not on file   Number of children: Not on file   Years of education: Not on file   Highest education level: Not on file  Occupational History   Not on file  Tobacco Use   Smoking status: Former    Current packs/day: 1.00    Types: Cigarettes   Smokeless tobacco: Not on file  Vaping Use   Vaping status: Former   Quit date: 12/29/2023  Substance and Sexual Activity   Alcohol use: No   Drug use: No   Sexual activity: Not on file  Other Topics Concern   Not on file  Social History Narrative   Not on file   Social Drivers of Health   Financial Resource Strain: Not on file  Food Insecurity: Not on file  Transportation Needs: Not on file  Physical Activity: Not on file  Stress: Not on file  Social Connections: Not on file  Intimate Partner Violence: Not on file   Family History  Problem Relation Age of Onset   Lung cancer Father    Diabetes Mother    Allergies  Allergen Reactions   Cephalexin Rash    CAUSES BAD HEADACHES  Keflex   Scheduled Meds: Continuous Infusions: PRN Meds:. Vitals:   03/02/24 0705  BP: (!) 112/54  Pulse: 66  Resp: 14  Temp: 98 F (36.7 C)  SpO2: 100%   Physical Exam HENT:     Head: Normocephalic.     Nose: Nose normal.  Eyes:     Pupils: Pupils are equal, round, and reactive to light.  Cardiovascular:     Rate and Rhythm: Normal rate.  Pulmonary:     Effort: Pulmonary effort is normal.  Abdominal:     General: Abdomen is flat.  Musculoskeletal:     Cervical back: Normal range of motion.  Neurological:     Mental Status: She is alert.   ASSESSMENT: RIGHT POSTERIOR COMMUNICATING ARTERY ANEURYSM  PLAN: DIAGNOSTIC CEREBRAL ANGIOGRAM  ASA: 1 M: 2

## 2024-03-02 NOTE — Discharge Instructions (Signed)

## 2024-03-02 NOTE — Addendum Note (Signed)
 Addended by: Amonda Brillhart on: 03/02/2024 08:27 AM   Modules accepted: Orders

## 2024-03-02 NOTE — Progress Notes (Signed)
 TR band removed. No hematoma or bleeding noted. Patient ambulated to the bathroom without difficulty. Patient and her mother received discharge instructions. No concerns voiced.

## 2024-03-09 ENCOUNTER — Ambulatory Visit (INDEPENDENT_AMBULATORY_CARE_PROVIDER_SITE_OTHER): Admitting: Neurosurgery

## 2024-03-09 VITALS — BP 131/85 | HR 51 | Ht 65.0 in | Wt 215.0 lb

## 2024-03-09 DIAGNOSIS — I671 Cerebral aneurysm, nonruptured: Secondary | ICD-10-CM | POA: Diagnosis not present

## 2024-03-09 MED ORDER — CLOPIDOGREL BISULFATE 75 MG PO TABS: 75.0000 mg | ORAL_TABLET | Freq: Every day | ORAL | 0 refills | Status: AC

## 2024-03-09 NOTE — Patient Instructions (Signed)
  Please see below for information in regards to your upcoming procedure.  Planned procedure : Right sided posterior communicating artery aneurysm coiling with balloon assistance    Date: 11/5   Time: You will receive a call to schedule your procedure with our Interventional Radiology Department. Should you not hear anything within 7 days after your office visit, please follow up with us  so we can follow up on scheduling.   Location: [place] Entrance A -  [address] [place], [place] [address]    Pre-op appointment at [facility] Pre-admit Testing: you will receive a call with a date/time for this appointment. If you are scheduled for an in person appointment, Pre-admit Testing is located at Entrance A. You will enter and check in at patient registration. During this appointment, they will advise you which medications you can take the morning of surgery, and which medications you will need to hold for surgery. Labs (such as blood work, EKG) may be done at your pre-op appointment. You are not required to fast for these labs. Should you need to change your pre-op appointment, please call Pre-admit testing at  [phone].  Eating/ Drinking: You should not eat or drink after midnight (NPO)  before your procedure. You can have small sips of clear liquids with your morning medications but everything else should be avoided.  Plavix  7 days prior to treatment and if I do have to stent her, we will continue her for 3 months   How to contact us :  If you have any questions/concerns before or after your Procedure you can reach us  at [phone], or you can send a mychart message. We can be reached by phone or mychart 8am-4pm, Monday-Friday.   Registered Nurse/Surgery scheduler:  Tinnie HERO RN-BSN Medical Assistants: Jesslyn DEL CMA Doctors: Dino Sable MD, Nancyann Burns MD

## 2024-03-09 NOTE — Progress Notes (Signed)
 48 year old lady with a history of a TIA who was found to have bilateral posterior communicating artery segment aneurysms.  She recently underwent a diagnostic angiogram and this shows that she has 4-1/2 mm aneurysm on the right side from the posterior communicating artery which is saccular with a 5-1/2 mm aneurysm on the left side which is very wide necked.  She was accompanied by her friend today and we reviewed the images together.  I gave her the options of doing nothing versus endovascular treatment versus open surgical treatment and I went over the risks and benefits of both in layman's terms.  She wants to have treatment for this aneurysm and is clearly very emotional about it.  I told her that if she was my wife, I would treat the right sided saccular aneurysm first and given the configuration, I recommended endovascular treatment with coiling.  I went over the procedure with her as well as the perioperative benefits and risks, in particular the risk of stroke and hemorrhage.  We talked about the admission as well as the follow-up for these aneurysms.  We talked about the contralateral aneurysm and I told her that I would start with the right side first and then at some point we would talk about treatment for the left side 1 and given their wide necked nature of it, it would not be treated with coiling only.  She wants to proceed with this and I will schedule her for a right sided posterior communicating artery aneurysm coiling with balloon assistance.  I have recommended that she starts her Plavix  7 days prior to treatment and if I do have to stent her, we will continue her for 3 months and if that does not happen, then we can stop it a week or 2 after surgery.

## 2024-03-13 ENCOUNTER — Telehealth: Payer: Self-pay

## 2024-03-13 NOTE — Telephone Encounter (Signed)
 Called and spoke with patient- relayed recommendations to patient- patient agreeable to plan of care and reports she will call back in 2026 Jan/Feb to schedule PV/colon appts if cleared by neuro to have procedure;  Patient verbalized understanding of information/instructions;    Patient advised to call back to the office at [phone] should questions/concerns arise;

## 2024-03-13 NOTE — Telephone Encounter (Signed)
 Thank you- I will contact the patient and provide recommendations

## 2024-03-13 NOTE — Telephone Encounter (Signed)
 Called and spoke with the patient- patient reports she is NOT currently taking PLAVIX ; she has a neurosurgery appt scheduled to have surgery on RIGHT sided cerebral arterial aneurysm on 04/25/2024 with PLAVIX  starting on 04/19/2024 (preop) with additional surgery for left side upcoming if warranted after ride sided surgery; if a stent is NOT placed during the surgery on 11/05, she will stay on the PLAVIX  for 2 weeks and follow up with neuro/neurosurgery 6 weeks post-op;  Patient advised that the colon might be postponed due to this recent diagnosis- patient acknowledged this information and is agreeable to plan of care as it will be advised   Please advise if this patient will need to postpone her colonoscopy with us  until neuro/neurosurgery clears her for this/to be off of PLAVIX ; Or if the patient can proceed with direct screening colonoscopy prior to her neurosurgery for her right sided cerebral arterial aneurysm  Norleen- please advise if this patient will need to have her procedure completed at the hospital for this reason as well  Please/thank you Bre, PV RN

## 2024-03-14 ENCOUNTER — Other Ambulatory Visit: Payer: Self-pay

## 2024-03-14 ENCOUNTER — Other Ambulatory Visit (HOSPITAL_COMMUNITY): Payer: Self-pay | Admitting: Neurosurgery

## 2024-03-14 DIAGNOSIS — I671 Cerebral aneurysm, nonruptured: Secondary | ICD-10-CM

## 2024-03-19 ENCOUNTER — Ambulatory Visit: Admitting: Diagnostic Neuroimaging

## 2024-03-23 ENCOUNTER — Encounter (INDEPENDENT_AMBULATORY_CARE_PROVIDER_SITE_OTHER): Payer: Self-pay | Admitting: Physician Assistant

## 2024-03-23 ENCOUNTER — Ambulatory Visit (INDEPENDENT_AMBULATORY_CARE_PROVIDER_SITE_OTHER): Admitting: Physician Assistant

## 2024-03-23 ENCOUNTER — Encounter

## 2024-03-23 ENCOUNTER — Ambulatory Visit (INDEPENDENT_AMBULATORY_CARE_PROVIDER_SITE_OTHER): Admitting: Audiology

## 2024-03-23 ENCOUNTER — Institutional Professional Consult (permissible substitution) (INDEPENDENT_AMBULATORY_CARE_PROVIDER_SITE_OTHER): Admitting: Otolaryngology

## 2024-03-23 VITALS — BP 122/78 | HR 69 | Temp 98.3°F

## 2024-03-23 DIAGNOSIS — H903 Sensorineural hearing loss, bilateral: Secondary | ICD-10-CM

## 2024-03-23 DIAGNOSIS — H9391 Unspecified disorder of right ear: Secondary | ICD-10-CM

## 2024-03-23 DIAGNOSIS — H9193 Unspecified hearing loss, bilateral: Secondary | ICD-10-CM

## 2024-03-23 NOTE — Progress Notes (Signed)
 Dear Dr. Duwaine, Here is my assessment for our mutual patient, Emily Reynolds. Thank you for allowing me the opportunity to care for your patient. Please do not hesitate to contact me should you have any other questions. Sincerely, Chyrl Cohen PA-C  Otolaryngology Clinic Note Referring provider: Dr. Duwaine HPI:  Emily Reynolds is a 48 y.o. female kindly referred by Dr. Duwaine   The patient is a 48 year old female presenting today for decreased hearing.  The patient notes that in April she was noted to have wax in her right ear, they irrigated it out.  Since that time she has had slight muffled hearing in the right side.  She notes this only happens when she talks, she feels like her voice is louder in her right ear.  She denies any pain, no ringing, no dizziness.  She denies any history of seasonal allergies no clicking or popping.  She notes that since that time the symptoms have progressively improved.   Independent Review of Additional Tests or Records:  Audiological evaluation 03/23/2024  Otoscopy: Right ear: Clear external ear canal and notable landmarks visualized on the tympanic membrane. Left ear:  Clear external ear canal and notable landmarks visualized on the tympanic membrane.   Tympanometry: Right ear: Type A- Normal external ear canal volume with normal middle ear pressure and tympanic membrane compliance. Left ear: Type A- Normal external ear canal volume with normal middle ear pressure and tympanic membrane compliance.   Pure tone Audiometry: Both ears: Normal hearing from [phone] , then mild presumably sensorineural hering loss at 8000 Hz.   Speech Audiometry: Right ear- Speech Reception Threshold (SRT) was obtained at 15 dBHL. Left ear-Speech Reception Threshold (SRT) was obtained at 10 dBHL.   Word Recognition Score Tested using NU-6 (recorded) Right ear: 100% was obtained at a presentation level of 50 dBHL with contralateral masking which is deemed as  excellent. Left  ear: 100% was obtained at a presentation level of 50 dBHL with contralateral masking which is deemed as  excellent.   The hearing test results were completed under headphones and results are deemed to be of good reliability. Test technique:  conventional     Impression: There is not a significant difference in pure-tone thresholds between ears. There is not a significant difference in the word recognition score in between ears.      PMH/Meds/All/SocHx/FamHx/ROS:   Past Medical History:  Diagnosis Date   History of gastroesophageal reflux (GERD)      History reviewed. No pertinent surgical history.  Family History  Problem Relation Age of Onset   Lung cancer Father    Diabetes Mother      Social Connections: Not on file      Current Outpatient Medications:    aspirin  81 MG chewable tablet, Chew 1 tablet (81 mg total) by mouth daily., Disp: 90 tablet, Rfl: 1   busPIRone (BUSPAR) 7.5 MG tablet, Take 7.5 mg by mouth 2 (two) times daily., Disp: , Rfl:    clopidogrel  (PLAVIX ) 75 MG tablet, Take 1 tablet (75 mg total) by mouth daily for 45 doses., Disp: 45 tablet, Rfl: 0   cyanocobalamin (VITAMIN B12) 1000 MCG/ML injection, Inject 1,000 mcg into the muscle every 30 (thirty) days., Disp: , Rfl:    fluorometholone (FML) 0.1 % ophthalmic suspension, Place 1 drop into both eyes 4 (four) times daily. (Patient not taking: Reported on 01/31/2024), Disp: , Rfl:    fluticasone (FLONASE) 50 MCG/ACT nasal spray, Place 1 spray into both nostrils daily., Disp: ,  Rfl:    furosemide (LASIX) 20 MG tablet, Take 20 mg by mouth. (Patient not taking: Reported on 01/31/2024), Disp: , Rfl:    hydrOXYzine (ATARAX) 10 MG tablet, Take 10 mg by mouth every 8 (eight) hours as needed for anxiety., Disp: , Rfl:    ibuprofen (ADVIL,MOTRIN) 200 MG tablet, Take 200 mg by mouth as needed., Disp: , Rfl:    Oxcarbazepine (TRILEPTAL) 300 MG tablet, Take 300 mg by mouth 2 (two) times daily., Disp: , Rfl:    Tapinarof  (VTAMA) 1 % CREA, Apply topically., Disp: , Rfl:    Physical Exam:   BP 122/78   Pulse 69   Temp 98.3 F (36.8 C)   SpO2 98%   Pertinent Findings  CN II-XII intact Bilateral EAC clear and TM intact with well pneumatized middle ear spaces Anterior rhinoscopy: Septum midline; bilateral inferior turbinates with no hypertrophy No lesions of oral cavity/oropharynx; dentition within normal limits No obvious neck masses/lymphadenopathy/thyromegaly No respiratory distress or stridor  Seprately Identifiable Procedures:  None  Impression & Plans:  Emily Reynolds is a 48 y.o. female with the following   Muffled hearing-  Complaints of somewhat muffled hearing on the right, she notes only happens when she speaks.  She notes the symptoms have improved, they are not bothersome.  Question eustachian tube dysfunction versus patulous eustachian tube dysfunction.  Audiological evaluation reassuring.  She has tried Flonase with no significant improvement in her symptoms.  At this point she would like to just continue to monitor her symptoms if they worsen she will return to the office.  Strict return precautions given.   - f/u PRN   Thank you for allowing me the opportunity to care for your patient. Please do not hesitate to contact me should you have any other questions.  Sincerely, Chyrl Cohen PA-C [facility] Phone: [phone] Fax: [phone]  03/23/2024, 1:56 PM

## 2024-03-23 NOTE — Progress Notes (Signed)
  [address] [place], [place] [address] [phone]  Audiological Evaluation    Name: Emily Reynolds     DOB:   [birth year]      MRN:   [id number]                                                                                     Service Date: 03/23/2024     Accompanied by: unaccompanied   Patient comes today after Reyes Cohen, PA-C sent a referral for a hearing evaluation due to concerns with muffled right hearing.   Symptoms Yes Details  Hearing loss  [x]  Muffled right hearing since April 2025. Her PCP cleaned her ears and the perception if ot being muffled reduced, but still feels some of it.  Tinnitus  []    Ear pain/ infections/pressure  []    Balance problems  []    Noise exposure history  []    Previous ear surgeries  []    Family history of hearing loss  []    Amplification  []    Other  []      Otoscopy: Right ear: Clear external ear canal and notable landmarks visualized on the tympanic membrane. Left ear:  Clear external ear canal and notable landmarks visualized on the tympanic membrane.  Tympanometry: Right ear: Type A- Normal external ear canal volume with normal middle ear pressure and tympanic membrane compliance. Left ear: Type A- Normal external ear canal volume with normal middle ear pressure and tympanic membrane compliance.  Pure tone Audiometry: Both ears: Normal hearing from [phone] , then mild presumably sensorineural hering loss at 8000 Hz.  Speech Audiometry: Right ear- Speech Reception Threshold (SRT) was obtained at 15 dBHL. Left ear-Speech Reception Threshold (SRT) was obtained at 10 dBHL.   Word Recognition Score Tested using NU-6 (recorded) Right ear: 100% was obtained at a presentation level of 50 dBHL with contralateral masking which is deemed as  excellent. Left ear: 100% was obtained at a presentation level of 50 dBHL with contralateral masking which is deemed as  excellent.   The hearing test results were completed under  headphones and results are deemed to be of good reliability. Test technique:  conventional    Impression: There is not a significant difference in pure-tone thresholds between ears. There is not a significant difference in the word recognition score in between ears.    Recommendations: Follow up with ENT as scheduled for today.  Return for a hearing evaluation if concerns with hearing changes arise or per MD recommendation.   Mauri Temkin MARIE LEROUX-MARTINEZ, AUD

## 2024-03-27 ENCOUNTER — Emergency Department (HOSPITAL_COMMUNITY)
Admission: EM | Admit: 2024-03-27 | Discharge: 2024-03-27 | Disposition: A | Discharge status: Home / Self Care | Attending: Emergency Medicine | Admitting: Emergency Medicine

## 2024-03-27 ENCOUNTER — Emergency Department (HOSPITAL_COMMUNITY)

## 2024-03-27 ENCOUNTER — Other Ambulatory Visit: Payer: Self-pay

## 2024-03-27 ENCOUNTER — Encounter (HOSPITAL_COMMUNITY): Payer: Self-pay

## 2024-03-27 DIAGNOSIS — E042 Nontoxic multinodular goiter: Secondary | ICD-10-CM | POA: Diagnosis not present

## 2024-03-27 DIAGNOSIS — R7989 Other specified abnormal findings of blood chemistry: Secondary | ICD-10-CM

## 2024-03-27 DIAGNOSIS — R891 Abnormal level of hormones in specimens from other organs, systems and tissues: Secondary | ICD-10-CM | POA: Diagnosis not present

## 2024-03-27 DIAGNOSIS — Z8679 Personal history of other diseases of the circulatory system: Secondary | ICD-10-CM | POA: Insufficient documentation

## 2024-03-27 DIAGNOSIS — G238 Other specified degenerative diseases of basal ganglia: Secondary | ICD-10-CM | POA: Diagnosis not present

## 2024-03-27 DIAGNOSIS — Z7982 Long term (current) use of aspirin: Secondary | ICD-10-CM | POA: Diagnosis not present

## 2024-03-27 DIAGNOSIS — G44209 Tension-type headache, unspecified, not intractable: Secondary | ICD-10-CM | POA: Diagnosis not present

## 2024-03-27 DIAGNOSIS — H547 Unspecified visual loss: Secondary | ICD-10-CM | POA: Diagnosis not present

## 2024-03-27 DIAGNOSIS — I671 Cerebral aneurysm, nonruptured: Secondary | ICD-10-CM | POA: Diagnosis not present

## 2024-03-27 DIAGNOSIS — H538 Other visual disturbances: Secondary | ICD-10-CM | POA: Diagnosis not present

## 2024-03-27 DIAGNOSIS — R9082 White matter disease, unspecified: Secondary | ICD-10-CM | POA: Diagnosis not present

## 2024-03-27 DIAGNOSIS — H579 Unspecified disorder of eye and adnexa: Secondary | ICD-10-CM

## 2024-03-27 DIAGNOSIS — G43109 Migraine with aura, not intractable, without status migrainosus: Secondary | ICD-10-CM | POA: Diagnosis not present

## 2024-03-27 DIAGNOSIS — R791 Abnormal coagulation profile: Secondary | ICD-10-CM | POA: Diagnosis not present

## 2024-03-27 DIAGNOSIS — Z8673 Personal history of transient ischemic attack (TIA), and cerebral infarction without residual deficits: Secondary | ICD-10-CM | POA: Diagnosis not present

## 2024-03-27 LAB — COMPREHENSIVE METABOLIC PANEL WITH GFR
ALT: 34 U/L (ref 0–44)
AST: 20 U/L (ref 15–41)
Albumin: 3.7 g/dL (ref 3.5–5.0)
Alkaline Phosphatase: 65 U/L (ref 38–126)
Anion gap: 11 (ref 5–15)
BUN: 14 mg/dL (ref 6–20)
CO2: 24 mmol/L (ref 22–32)
Calcium: 8.9 mg/dL (ref 8.9–10.3)
Chloride: 106 mmol/L (ref 98–111)
Creatinine, Ser: 0.93 mg/dL (ref 0.44–1.00)
GFR, Estimated: >60 mL/min (ref >60)
Glucose, Bld: 113 mg/dL — ABNORMAL HIGH (ref 70–99)
Potassium: 4.1 mmol/L (ref 3.5–5.1)
Sodium: 141 mmol/L (ref 135–145)
Total Bilirubin: 0.7 mg/dL (ref 0.0–1.2)
Total Protein: 6.9 g/dL (ref 6.5–8.1)

## 2024-03-27 LAB — HCG, SERUM, QUALITATIVE: Preg, Serum: POSITIVE — AB

## 2024-03-27 LAB — DIFFERENTIAL
Abs Immature Granulocytes: 0.02 K/uL (ref 0.00–0.07)
Basophils Absolute: 0.1 K/uL (ref 0.0–0.1)
Basophils Relative: 1 %
Eosinophils Absolute: 0.1 K/uL (ref 0.0–0.5)
Eosinophils Relative: 2 %
Immature Granulocytes: 0 %
Lymphocytes Relative: 38 %
Lymphs Abs: 2.4 K/uL (ref 0.7–4.0)
Monocytes Absolute: 0.6 K/uL (ref 0.1–1.0)
Monocytes Relative: 10 %
Neutro Abs: 3.1 K/uL (ref 1.7–7.7)
Neutrophils Relative %: 49 %

## 2024-03-27 LAB — CBC
HCT: 41.6 % (ref 36.0–46.0)
Hemoglobin: 13.7 g/dL (ref 12.0–15.0)
MCH: 31 pg (ref 26.0–34.0)
MCHC: 32.9 g/dL (ref 30.0–36.0)
MCV: 94.1 fL (ref 80.0–100.0)
Platelets: 183 K/uL (ref 150–400)
RBC: 4.42 MIL/uL (ref 3.87–5.11)
RDW: 12.2 % (ref 11.5–15.5)
WBC: 6.3 K/uL (ref 4.0–10.5)
nRBC: 0 % (ref 0.0–0.2)

## 2024-03-27 LAB — I-STAT CHEM 8, ED
BUN: 16 mg/dL (ref 6–20)
Calcium, Ion: 1.08 mmol/L — ABNORMAL LOW (ref 1.15–1.40)
Chloride: 104 mmol/L (ref 98–111)
Creatinine, Ser: 1.1 mg/dL — ABNORMAL HIGH (ref 0.44–1.00)
Glucose, Bld: 112 mg/dL — ABNORMAL HIGH (ref 70–99)
HCT: 41 % (ref 36.0–46.0)
Hemoglobin: 13.9 g/dL (ref 12.0–15.0)
Potassium: 4 mmol/L (ref 3.5–5.1)
Sodium: 141 mmol/L (ref 135–145)
TCO2: 25 mmol/L (ref 22–32)

## 2024-03-27 LAB — APTT: aPTT: 28 s (ref 24–36)

## 2024-03-27 LAB — ETHANOL: Alcohol, Ethyl (B): <15 mg/dL (ref <15)

## 2024-03-27 LAB — PROTIME-INR
INR: 0.9 (ref 0.8–1.2)
Prothrombin Time: 13.2 s (ref 11.4–15.2)

## 2024-03-27 LAB — CBG MONITORING, ED: Glucose-Capillary: 103 mg/dL — ABNORMAL HIGH (ref 70–99)

## 2024-03-27 MED ORDER — IOHEXOL 350 MG/ML SOLN
75.0000 mL | Freq: Once | INTRAVENOUS | Status: AC | PRN
  Administered 2024-03-27: 75 mL via INTRAVENOUS

## 2024-03-27 MED ORDER — METHOCARBAMOL 750 MG PO TABS: 750.0000 mg | ORAL_TABLET | Freq: Three times a day (TID) | ORAL | 0 refills | Status: AC | PRN

## 2024-03-27 NOTE — ED Triage Notes (Addendum)
 Pt c/o left peripheral vision changes that started today about 1400. Describes the vision changes as blurry and at one point it seemed like her vision went black. Pt also reports having a stiff neck that she noticed about 2 weeks ago.  Hx of stroke

## 2024-03-27 NOTE — ED Notes (Signed)
 Patient requesting to speak with EDP about CT results prior to being discharged. Bernard, MD notified.

## 2024-03-27 NOTE — Discharge Instructions (Addendum)
 It was our pleasure to provide your ER care today - we hope that you feel better.  Drink plenty of fluids/stay well hydrated. If tension type headache, try gentle massage, heat therapy and relaxation exercises.  Take ibuprofen or acetaminophen as need. You may also take robaxin as need -  no driving when taking.   Follow up closely with neurology/neurosurgery as planned.   Your pregnancy test/hcg hormone level continues to be at a low positive level - follow up with primary care doctor/ob-gyn doctor in the next couple weeks.  Your ct scan made incidental note of: Multiple thyroid nodules, the largest within the left lobe measuring 17 mm. A non-emergent thyroid ultrasound is recommended  for further evaluation.  Discuss above CT scan findings with primary care doctor this week and have them arrange the follow up ultrasound imaging and appropriate follow up testing.   Return to ER right away if worse, new symptoms, fevers, new/severe pain, one-sided numbness/weakness, change in speech or vision, severe abdominal pain, or other concern.

## 2024-03-27 NOTE — ED Provider Triage Note (Addendum)
 Emergency Medicine Provider Triage Evaluation Note  CHARLET HARR , a 48 y.o. female  was evaluated in triage.  Pt complains of visual change.  Started around 2 PM today.  Central vision was okay but peripheral vision was darker and wavy.  Associated with headache.  Also has had some posterior left neck pain for about a week.  Had recently started BuSpar for anxiety and stopped the medication in case it could be a cause.  Has a known history of cerebral aneurysms..  Review of Systems  Positive: Visual symptoms Negative: Numbness weakness  Physical Exam  BP (!) 142/84 (BP Location: Right Arm)   Pulse 80   Temp 98.4 F (36.9 C) (Oral)   Resp 18   SpO2 100%  Gen:   Awake, no distress   Resp:  Normal effort  MSK:   Moves extremities without difficulty  Other:    Medical Decision Making  Medically screening exam initiated at 3:24 PM.  Appropriate orders placed.  Aika Brzoska Boggio was informed that the remainder of the evaluation will be completed by another provider, this initial triage assessment does not replace that evaluation, and the importance of remaining in the ED until their evaluation is complete.     Towana Ozell BROCKS, MD 03/27/24 1525  I messaged Dr. Matthews neurology and she said to go ahead and activate on her.  Code stroke activated at 3:30 PM.   Towana Ozell BROCKS, MD 03/27/24 1530

## 2024-03-27 NOTE — Consult Note (Signed)
 NEUROLOGY CONSULT NOTE   Date of service: March 27, 2024 Patient Name: Emily Reynolds MRN:  [id number] DOB:  August 31, 1975 Chief Complaint: left peripheral vision loss Requesting Provider: No att. providers found  History of Present Illness  Emily Reynolds is a 48 y.o. female with hx of right ICA aneurysm, right PCA aneurysm pending coiling, anxiety, GERD and smoking who presents with loss of left sided peripheral vision.  She reports that the vision loss occurred suddenly while she was at work and resolved spontaneously over an hour.  She states that she woke up with a headache and did have a headache at the time she had vision loss, but that it has als9o resolved.  She has also had some neck stiffness but no other recent symptoms.  LKW: 1400 Modified rankin score: 0-Completely asymptomatic and back to baseline post- stroke IV Thrombolysis: No, deficits resolved EVT: No, no LVO  NIHSS components Score: Comment  1a Level of Conscious 0[x]  1[]  2[]  3[]      1b LOC Questions 0[x]  1[]  2[]       1c LOC Commands 0[x]  1[]  2[]       2 Best Gaze 0[x]  1[]  2[]       3 Visual 0[x]  1[]  2[]  3[]      4 Facial Palsy 0[x]  1[]  2[]  3[]      5a Motor Arm - left 0[x]  1[]  2[]  3[]  4[]  UN[]    5b Motor Arm - Right 0[x]  1[]  2[]  3[]  4[]  UN[]    6a Motor Leg - Left 0[x]  1[]  2[]  3[]  4[]  UN[]    6b Motor Leg - Right 0[x]  1[]  2[]  3[]  4[]  UN[]    7 Limb Ataxia 0[x]  1[]  2[]  UN[]      8 Sensory 0[x]  1[]  2[]  UN[]      9 Best Language 0[x]  1[]  2[]  3[]      10 Dysarthria 0[x]  1[]  2[]  UN[]      11 Extinct. and Inattention 0[x]  1[]  2[]       TOTAL:0       ROS  Comprehensive ROS performed and pertinent positives documented in HPI   Past History   Past Medical History:  Diagnosis Date   History of gastroesophageal reflux (GERD)     History reviewed. No pertinent surgical history.  Family History: Family History  Problem Relation Age of Onset   Lung cancer Father    Diabetes Mother     Social History  reports that she  has quit smoking. Her smoking use included cigarettes. She does not have any smokeless tobacco history on file. She reports that she does not drink alcohol and does not use drugs.  Allergies  Allergen Reactions   Cephalexin Rash    CAUSES BAD HEADACHES  Keflex    Medications  No current facility-administered medications for this encounter.  Current Outpatient Medications:    aspirin  81 MG chewable tablet, Chew 1 tablet (81 mg total) by mouth daily., Disp: 90 tablet, Rfl: 1   busPIRone (BUSPAR) 7.5 MG tablet, Take 7.5 mg by mouth 2 (two) times daily., Disp: , Rfl:    clopidogrel  (PLAVIX ) 75 MG tablet, Take 1 tablet (75 mg total) by mouth daily for 45 doses., Disp: 45 tablet, Rfl: 0   cyanocobalamin (VITAMIN B12) 1000 MCG/ML injection, Inject 1,000 mcg into the muscle every 30 (thirty) days., Disp: , Rfl:    fluorometholone (FML) 0.1 % ophthalmic suspension, Place 1 drop into both eyes 4 (four) times daily. (Patient not taking: Reported on 01/31/2024), Disp: , Rfl:    fluticasone (FLONASE) 50 MCG/ACT nasal  spray, Place 1 spray into both nostrils daily., Disp: , Rfl:    furosemide (LASIX) 20 MG tablet, Take 20 mg by mouth. (Patient not taking: Reported on 01/31/2024), Disp: , Rfl:    hydrOXYzine (ATARAX) 10 MG tablet, Take 10 mg by mouth every 8 (eight) hours as needed for anxiety., Disp: , Rfl:    ibuprofen (ADVIL,MOTRIN) 200 MG tablet, Take 200 mg by mouth as needed., Disp: , Rfl:    Oxcarbazepine (TRILEPTAL) 300 MG tablet, Take 300 mg by mouth 2 (two) times daily., Disp: , Rfl:    Tapinarof (VTAMA) 1 % CREA, Apply topically., Disp: , Rfl:   Vitals   Vitals:   04-Apr-2024 1506 04-04-24 1524  BP: (!) 142/84   Pulse: 80   Resp: 18   Temp: 98.4 F (36.9 C)   TempSrc: Oral   SpO2: 100%   Weight:  93 kg  Height:  5' 5 (1.651 m)    Body mass index is 34.11 kg/m.   Physical Exam   Constitutional: Appears well-developed and well-nourished.  Psych: Affect appropriate to situation.   Eyes: No scleral injection.  HENT: No OP obstruction.  Head: Normocephalic.  Respiratory: Effort normal, non-labored breathing.  Skin: WDI.   Neurologic Examination    NEURO: Anxious looking middle-aged Caucasian lady Mental Status: AA&Ox3  Speech/Language: speech is without dysarthria or aphasia.    Cranial Nerves:  II: PERRL. Visual fields full.  III, IV, VI: EOMI. Eyelids elevate symmetrically.  V: Sensation is intact to light touch and symmetrical to face.  VII: Smile is symmetrical.  VIII: hearing intact to voice. IX, X: Phonation is normal.  XII: tongue is midline without fasciculations. Motor: Able to move all four extremities with antigravity strength Tone: is normal and bulk is normal Sensation- Intact to light touch bilaterally. Extinction absent to light touch to DSS.  Coordination: FTN intact bilaterally, HKS: no ataxia in BLE. Gait- deferred   Labs/Imaging/Neurodiagnostic studies   CBC:  Recent Labs  Lab April 04, 2024 1539  HGB 13.9  HCT 41.0   Basic Metabolic Panel:  Lab Results  Component Value Date   NA 141 04-04-24   K 4.0 04/04/2024   CO2 24 12/28/2023   GLUCOSE 112 (H) 04-Apr-2024   BUN 16 04-Apr-2024   CREATININE 1.10 (H) 04-04-24   CALCIUM 9.3 12/28/2023   GFRNONAA >60 12/28/2023   Lipid Panel:  Lab Results  Component Value Date   LDLCALC 124 (H) 11/16/2013   HgbA1c:  Lab Results  Component Value Date   HGBA1C 5.0 01/10/2024    CT Head without contrast(Personally reviewed): No acute abnormality  CT angio Head and Neck with contrast(Personally reviewed): No LVO or hemodynamically significant stenosis, note 5 mm posterior communicating artery right ICA and 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 female with hx of right ICA aneurysm, right PCA aneurysm pending coiling, anxiety, GERD and smoking who presents with transient left sided visual field deficit and headache.  She reports that she woke  up with a headache and developed another headache shortly before experiencing the visual field deficit.  Her vision has returned to normal, and the headache has resolved.  Head CT was negative for acute abnormality, and CTA demonstrates no LVO and stable known aneurysms.  Most likely etiology of the deficit is complicated migraine.  Patient will need to follow-up with Dr. Rosemarie in the clinic and can be discharged home.  RECOMMENDATIONS  -May discharge patient home  with outpatient neurology follow-up for complicated migraine. ______________________________________________________________________  Patient seen by NP with MD, MD to edit note as needed.  Signed, Cortney E Everitt Clint Kill, NP [facility]   I have personally obtained history,examined this patient, reviewed notes, independently viewed imaging studies, participated in medical decision making and plan of care.ROS completed by me personally and pertinent positives fully documented  I have made any additions or clarifications directly to the above note. Agree with note above.  Patient known to me from previous office visit and found to have incidental intracranial aneurysm for which she is planning endovascular treatment with Dr. Janjua next month.  She developed headache 2 days subsequently followed by transient left-sided peripheral vision loss both of which now appear to have resolved.  Possibly complicated migraine episode.  Neurological exam, CT scan of the head as well as CT angiogram are all unremarkable.  Recommend discharge home and no further workup at this time.  She was advised to call return if his symptoms recur or got worse.  Keep scheduled follow-up appointment with me.  And discussion patient answered questions.  Discussed with Dr. Franky Gaul in the ER MD   I personally spent a total of 55 minutes in the care of the patient today including getting/reviewing separately obtained history, performing a medically appropriate  exam/evaluation, counseling and educating, placing orders, referring and communicating with other health care professionals, documenting clinical information in the EHR, independently interpreting results, and coordinating care.        Eather Popp, MD Medical Director [facility] Pager: [phone] 03/27/2024 7:50 PM

## 2024-03-27 NOTE — ED Notes (Signed)
 MD butler notified.

## 2024-03-27 NOTE — Code Documentation (Signed)
 Stroke Response Nurse Documentation Code Documentation  Emily Reynolds is a 48 y.o. female arriving to [place] [facility]  via Private Vehicle on 03/27/2024 with past medical hx of known cerebral aneurysm, GERD anxiety. On No antithrombotic. Code stroke was activated by ED.   Patient from work where she was LKW at 1400 and now complaining of left sided vision loss . As of time of CT, symptoms have resolved.  Stroke team at the bedside on patient arrival. Labs drawn and patient cleared for CT by Dr. Towana. Patient to CT with team. NIHSS 0, no deficits on exam.See documentation for details and code stroke times. Patient with no deficits on exam. The following imaging was completed:  CT Head and CTA. Patient is not a candidate for IV Thrombolytic due to resolution of symptoms. Patient is not a candidate for IR due to no LVO on advanced imaging.   Care Plan:   No acute treatment/TIA alert: q2h x 12 hours NIHSS & VS, then q4h    Bedside handoff with ED RN Emily Reynolds.    Emily Reynolds  Stroke Response RN

## 2024-03-27 NOTE — ED Provider Notes (Addendum)
 [place] EMERGENCY DEPARTMENT AT [facility] Provider Note   CSN: [id number] Arrival date & time: 03/27/24  1501     Patient presents with: Visual Field Change   Emily Reynolds is a 48 y.o. female.   Pt with c/o feeling that peripheral aspect of vision bilateral was blurry. States was sitting at desk at work when symptoms occurred.  States her forward/central vision seemed normal, but peripherally, laterally, bilaterally, things seemed blurry. No amaurosis. States tried to close/cover either eye and seemed present when either eye was closed as well. No eye pain or redness. No tearing. Has noted intermittent headache in past 1-2 weeks, and mild headaches this AM, posteriorly, at base occiput.  No acute, abrupt, 'worst', or 'thunderclap' type head pain. States has felt under a lot of stress, some anxiety. Denies hx complex migraines. No syncope. No change in speech or vision. No new numbness/weakness or loss of normal functional ability. No problems w balance. Indicates vision seems normal now.   The history is provided by the patient and medical records.       Prior to Admission medications   Medication Sig Start Date End Date Taking? Authorizing Provider  aspirin  81 MG chewable tablet Chew 1 tablet (81 mg total) by mouth daily. 12/29/23   Ula Prentice SAUNDERS, MD  busPIRone (BUSPAR) 7.5 MG tablet Take 7.5 mg by mouth 2 (two) times daily. 02/22/24   [provider]  clopidogrel  (PLAVIX ) 75 MG tablet Take 1 tablet (75 mg total) by mouth daily for 45 doses. 03/09/24 04/23/24  Janjua, Rashid M, MD  cyanocobalamin (VITAMIN B12) 1000 MCG/ML injection Inject 1,000 mcg into the muscle every 30 (thirty) days. 10/14/23   [provider]  fluorometholone (FML) 0.1 % ophthalmic suspension Place 1 drop into both eyes 4 (four) times daily. Patient not taking: Reported on 01/31/2024 12/02/23   [provider]  fluticasone (FLONASE) 50 MCG/ACT nasal spray Place 1 spray into both  nostrils daily.    [provider]  furosemide (LASIX) 20 MG tablet Take 20 mg by mouth. Patient not taking: Reported on 01/31/2024    [provider]  hydrOXYzine (ATARAX) 10 MG tablet Take 10 mg by mouth every 8 (eight) hours as needed for anxiety.    [provider]  ibuprofen (ADVIL,MOTRIN) 200 MG tablet Take 200 mg by mouth as needed.    [provider]  Oxcarbazepine (TRILEPTAL) 300 MG tablet Take 300 mg by mouth 2 (two) times daily.    [provider]  Tapinarof (VTAMA) 1 % CREA Apply topically.    [provider]    Allergies: Cephalexin    Review of Systems  Constitutional:  Negative for chills and fever.  HENT:  Negative for sinus pain and sore throat.   Eyes:  Positive for visual disturbance. Negative for pain and redness.  Respiratory:  Negative for cough and shortness of breath.   Cardiovascular:  Negative for chest pain, palpitations and leg swelling.  Gastrointestinal:  Negative for abdominal pain, diarrhea and vomiting.  Genitourinary:  Negative for flank pain.  Musculoskeletal:  Negative for back pain and neck pain.  Neurological:  Negative for speech difficulty, weakness and numbness.  Psychiatric/Behavioral:  Negative for confusion.     Updated Vital Signs BP (!) 142/84 (BP Location: Right Arm)   Pulse 80   Temp 98.4 F (36.9 C) (Oral)   Resp 18   Ht 1.651 m (5' 5)   Wt 93 kg   SpO2 100%  BMI 34.11 kg/m   Physical Exam Vitals and nursing note reviewed.  Constitutional:      Appearance: Normal appearance. She is well-developed.  HENT:     Head: Atraumatic.     Comments: Lower occiput/upper neck, muscular tenderness in area of recent headache, ?possibly stress/tension type headache.  No sts noted,  no skin changes. No sinus or temporal pain or tenderness.     Nose: Nose normal.     Mouth/Throat:     Mouth: Mucous membranes are moist.  Eyes:     General: No scleral icterus.    Extraocular Movements:  Extraocular movements intact.     Conjunctiva/sclera: Conjunctivae normal.     Pupils: Pupils are equal, round, and reactive to light.     Comments: Fundus w sharp disc margins, no PE or acute abnormality noted.  Neck:     Vascular: No carotid bruit.     Trachea: No tracheal deviation.     Comments: Trachea midline, no neck stiffness or rigidity. Thyroid not grossly enlarged or tender. No bruits.  Cardiovascular:     Rate and Rhythm: Normal rate and regular rhythm.     Pulses: Normal pulses.     Heart sounds: Normal heart sounds. No murmur heard.    No friction rub. No gallop.  Pulmonary:     Effort: Pulmonary effort is normal. No respiratory distress.     Breath sounds: Normal breath sounds.  Abdominal:     General: Bowel sounds are normal. There is no distension.     Palpations: Abdomen is soft.     Tenderness: There is no abdominal tenderness.  Musculoskeletal:        General: No swelling or tenderness.     Cervical back: Normal range of motion and neck supple. No rigidity. No muscular tenderness.     Right lower leg: No edema.     Left lower leg: No edema.     Comments: C spine non tender, aligned. Normal rom.   Skin:    General: Skin is warm and dry.     Findings: No rash.  Neurological:     General: No focal deficit present.     Mental Status: She is alert and oriented to person, place, and time.     Comments: Alert, speech normal. Motor/sens grossly intact bil. Stre 5/5. No pronator drift. Steady gait. Visual fields appear intact.  No cranial n deficit noted.   Psychiatric:        Mood and Affect: Mood normal.     (all labs ordered are listed, but only abnormal results are displayed) Results for orders placed or performed during the hospital encounter of 03/27/24  Protime-INR   Collection Time: 03/27/24  3:30 PM  Result Value Ref Range   Prothrombin Time 13.2 11.4 - 15.2 seconds   INR 0.9 0.8 - 1.2  APTT   Collection Time: 03/27/24  3:30 PM  Result Value Ref  Range   aPTT 28 24 - 36 seconds  CBC   Collection Time: 03/27/24  3:30 PM  Result Value Ref Range   WBC 6.3 4.0 - 10.5 K/uL   RBC 4.42 3.87 - 5.11 MIL/uL   Hemoglobin 13.7 12.0 - 15.0 g/dL   HCT 58.3 63.9 - 53.9 %   MCV 94.1 80.0 - 100.0 fL   MCH 31.0 26.0 - 34.0 pg   MCHC 32.9 30.0 - 36.0 g/dL   RDW 87.7 88.4 - 84.4 %   Platelets 183 150 - 400 K/uL  nRBC 0.0 0.0 - 0.2 %  Differential   Collection Time: 03/27/24  3:30 PM  Result Value Ref Range   Neutrophils Relative % 49 %   Neutro Abs 3.1 1.7 - 7.7 K/uL   Lymphocytes Relative 38 %   Lymphs Abs 2.4 0.7 - 4.0 K/uL   Monocytes Relative 10 %   Monocytes Absolute 0.6 0.1 - 1.0 K/uL   Eosinophils Relative 2 %   Eosinophils Absolute 0.1 0.0 - 0.5 K/uL   Basophils Relative 1 %   Basophils Absolute 0.1 0.0 - 0.1 K/uL   Immature Granulocytes 0 %   Abs Immature Granulocytes 0.02 0.00 - 0.07 K/uL  CBG monitoring, ED   Collection Time: 03/27/24  3:33 PM  Result Value Ref Range   Glucose-Capillary 103 (H) 70 - 99 mg/dL  I-stat chem 8, ED   Collection Time: 03/27/24  3:39 PM  Result Value Ref Range   Sodium 141 135 - 145 mmol/L   Potassium 4.0 3.5 - 5.1 mmol/L   Chloride 104 98 - 111 mmol/L   BUN 16 6 - 20 mg/dL   Creatinine, Ser 8.89 (H) 0.44 - 1.00 mg/dL   Glucose, Bld 887 (H) 70 - 99 mg/dL   Calcium, Ion 1.08 (L) 1.15 - 1.40 mmol/L   TCO2 25 22 - 32 mmol/L   Hemoglobin 13.9 12.0 - 15.0 g/dL   HCT 58.9 63.9 - 53.9 %   CT HEAD CODE STROKE WO CONTRAST Result Date: 03/27/2024 CLINICAL DATA:  Code stroke. Neuro deficit, acute, stroke suspected. EXAM: CT HEAD WITHOUT CONTRAST TECHNIQUE: Contiguous axial images were obtained from the base of the skull through the vertex without intravenous contrast. RADIATION DOSE REDUCTION: This exam was performed according to the departmental dose-optimization program which includes automated exposure control, adjustment of the mA and/or kV according to patient size and/or use of iterative  reconstruction technique. COMPARISON:  Non-contrast head CT and CT angiogram head/neck 12/29/2023. Brain MRI 12/29/2023. Images from catheter-based angiography 03/02/2024. FINDINGS: Brain: No age-advanced or lobar predominant cerebral atrophy. Nonspecific cerebral white matter disease better appreciated on the prior brain MRI of 12/29/2023. Mineralization within the basal ganglia bilaterally. There is no acute intracranial hemorrhage. No demarcated cortical infarct. No extra-axial fluid collection. No evidence of an intracranial mass. No midline shift. Vascular: No hyperdense vessel. Atherosclerotic calcifications. Skull: No calvarial fracture or aggressive osseous lesion. Sinuses/Orbits: No mass or acute finding within the imaged orbits. No significant paranasal sinus disease at the imaged levels. ASPECTS [facility] Stroke Program Early CT Score) - Ganglionic level infarction (caudate, lentiform nuclei, internal capsule, insula, M1-M3 cortex): 7 - Supraganglionic infarction (M4-M6 cortex): 3 Total score (0-10 with 10 being normal): 10 No evidence of an acute intracranial abnormality. These results were communicated to Dr. Rosemarie at 3:54 pmon 10/7/2025by text page via the [facility] messaging system. IMPRESSION: 1.  No evidence of an acute intracranial abnormality. 2. Nonspecific cerebral white matter disease, better appreciated on the prior brain MRI of 12/29/2023. Electronically Signed   By: Rockey Childs D.O.   On: 03/27/2024 15:54     EKG: EKG Interpretation Date/Time:  Tuesday March 27 2024 15:11:20 EDT Ventricular Rate:  77 PR Interval:  176 QRS Duration:  134 QT Interval:  422 QTC Calculation: 477 R Axis:   -1  Text Interpretation: Normal sinus rhythm Right bundle branch block Abnormal ECG When compared with ECG of 25-Mar-2001 13:11, axis change and new RBB Confirmed by Towana Sharper [phone]) on 03/27/2024 3:23:34 PM  Radiology: CT HEAD CODE  STROKE WO CONTRAST Result Date: 03/27/2024 CLINICAL DATA:   Code stroke. Neuro deficit, acute, stroke suspected. EXAM: CT HEAD WITHOUT CONTRAST TECHNIQUE: Contiguous axial images were obtained from the base of the skull through the vertex without intravenous contrast. RADIATION DOSE REDUCTION: This exam was performed according to the departmental dose-optimization program which includes automated exposure control, adjustment of the mA and/or kV according to patient size and/or use of iterative reconstruction technique. COMPARISON:  Non-contrast head CT and CT angiogram head/neck 12/29/2023. Brain MRI 12/29/2023. Images from catheter-based angiography 03/02/2024. FINDINGS: Brain: No age-advanced or lobar predominant cerebral atrophy. Nonspecific cerebral white matter disease better appreciated on the prior brain MRI of 12/29/2023. Mineralization within the basal ganglia bilaterally. There is no acute intracranial hemorrhage. No demarcated cortical infarct. No extra-axial fluid collection. No evidence of an intracranial mass. No midline shift. Vascular: No hyperdense vessel. Atherosclerotic calcifications. Skull: No calvarial fracture or aggressive osseous lesion. Sinuses/Orbits: No mass or acute finding within the imaged orbits. No significant paranasal sinus disease at the imaged levels. ASPECTS [facility] Stroke Program Early CT Score) - Ganglionic level infarction (caudate, lentiform nuclei, internal capsule, insula, M1-M3 cortex): 7 - Supraganglionic infarction (M4-M6 cortex): 3 Total score (0-10 with 10 being normal): 10 No evidence of an acute intracranial abnormality. These results were communicated to Dr. Rosemarie at 3:54 pmon 10/7/2025by text page via the [facility] messaging system. IMPRESSION: 1.  No evidence of an acute intracranial abnormality. 2. Nonspecific cerebral white matter disease, better appreciated on the prior brain MRI of 12/29/2023. Electronically Signed   By: Rockey Childs D.O.   On: 03/27/2024 15:54     Procedures   Medications Ordered in the ED   iohexol  (OMNIPAQUE ) 350 MG/ML injection 75 mL (75 mLs Intravenous Contrast Given 03/27/24 1545)                                    Medical Decision Making Problems Addressed: Elevated serum human chorionic gonadotropin  (hCG) level: acute illness or injury    Details: Close f/u recommended History of cerebral aneurysm: chronic illness or injury Multiple thyroid nodules: acute illness or injury    Details: Pcp f/u and u/s recommended Tension headache: acute illness or injury with systemic symptoms Visual symptoms: acute illness or injury with systemic symptoms that poses a threat to life or bodily functions  Amount and/or Complexity of Data Reviewed External Data Reviewed: notes. Labs: ordered. Decision-making details documented in ED Course. Radiology: ordered and independent interpretation performed. Decision-making details documented in ED Course. ECG/medicine tests: ordered and independent interpretation performed. Decision-making details documented in ED Course. Discussion of management or test interpretation with external provider(s): neurology  Risk Prescription drug management. Decision regarding hospitalization.   Iv ns. Continuous pulse ox and cardiac monitoring. Labs ordered/sent. Imaging ordered.   Code stroke activation on initial arrival - Dr Sethi/neurology emergently consulted and evaluated at bedside.   Differential diagnosis includes cva, atypical migraine, etc . Dispo decision including potential need for admission considered - will get labs and imaging and reassess.   Reviewed nursing notes and prior charts for additional history. External reports reviewed.   Cardiac monitor: sinus rhythm, rate 78  Labs reviewed/interpreted by me - chem unremarkable. Wbc and hgb normal.   CT reviewed/interpreted by me - no hem.   Neurology has reviewed ct and cta, and indicates symptoms resolved, and rec d/c to home. Pt indicates has neurology/ns follow up already  arranged.  Symptoms remain resolved and patient appears stable for d/c per neurology plan.   Rec close pcp/neurology/ns f/u.  Return precautions provided.       Final diagnoses:  None    ED Discharge Orders     None          Bernard Drivers, MD 03/27/24 1654

## 2024-03-29 DIAGNOSIS — F419 Anxiety disorder, unspecified: Secondary | ICD-10-CM | POA: Diagnosis not present

## 2024-03-29 DIAGNOSIS — I671 Cerebral aneurysm, nonruptured: Secondary | ICD-10-CM | POA: Diagnosis not present

## 2024-04-06 ENCOUNTER — Encounter: Admitting: Pediatrics

## 2024-04-17 ENCOUNTER — Encounter: Payer: Self-pay | Admitting: Neurosurgery

## 2024-04-18 NOTE — Pre-Procedure Instructions (Signed)
 Surgical Instructions   Your procedure is scheduled on April 25, 2024. Report to [facility] Main Entrance A at 6:30 A.M., then check in with the Admitting office. Any questions or running late day of surgery: call [phone]  Questions prior to your surgery date: call [phone], Monday-Friday, 8am-4pm. If you experience any cold or flu symptoms such as cough, fever, chills, shortness of breath, etc. between now and your scheduled surgery, please notify us  at the above number.     Remember:  Do not eat or drink after midnight the night before your surgery    Take these medicines the morning of surgery with A SIP OF WATER: clopidogrel  (PLAVIX )  Oxcarbazepine (TRILEPTAL)    May take these medicines IF NEEDED: methocarbamol (ROBAXIN)  XANAX    One week prior to surgery, STOP taking any Aspirin  (unless otherwise instructed by your surgeon) Aleve, Naproxen, Ibuprofen, Motrin, Advil, Goody's, BC's, all herbal medications, fish oil, and non-prescription vitamins.                     Do NOT Smoke (Tobacco/Vaping) for 24 hours prior to your procedure.  If you use a CPAP at night, you may bring your mask/headgear for your overnight stay.   You will be asked to remove any contacts, glasses, piercing's, hearing aid's, dentures/partials prior to surgery. Please bring cases for these items if needed.    Patients discharged the day of surgery will not be allowed to drive home, and someone needs to stay with them for 24 hours.  SURGICAL WAITING ROOM VISITATION Patients may have no more than 2 support people in the waiting area - these visitors may rotate.   Pre-op nurse will coordinate an appropriate time for 1 ADULT support person, who may not rotate, to accompany patient in pre-op.  Children under the age of 36 must have an adult with them who is not the patient and must remain in the main waiting area with an adult.  If the patient needs to stay at the hospital during part of  their recovery, the visitor guidelines for inpatient rooms apply.  Please refer to the [facility] website for the visitor guidelines for any additional information.   If you received a COVID test during your pre-op visit  it is requested that you wear a mask when out in public, stay away from anyone that may not be feeling well and notify your surgeon if you develop symptoms. If you have been in contact with anyone that has tested positive in the last 10 days please notify you surgeon.      Pre-operative CHG Bathing Instructions   You can play a key role in reducing the risk of infection after surgery. Your skin needs to be as free of germs as possible. You can reduce the number of germs on your skin by washing with CHG (chlorhexidine gluconate) soap before surgery. CHG is an antiseptic soap that kills germs and continues to kill germs even after washing.   DO NOT use if you have an allergy to chlorhexidine/CHG or antibacterial soaps. If your skin becomes reddened or irritated, stop using the CHG and notify one of our RNs at [phone].              TAKE A SHOWER THE NIGHT BEFORE SURGERY   Please keep in mind the following:  DO NOT shave, including legs and underarms, 48 hours prior to surgery.   You may shave your face before/day of surgery.  Place  clean sheets on your bed the night before surgery Use a clean washcloth (not used since being washed) for shower. DO NOT sleep with pet's night before surgery.  CHG Shower Instructions:  Wash your face and private area with normal soap. If you choose to wash your hair, wash first with your normal shampoo.  After you use shampoo/soap, rinse your hair and body thoroughly to remove shampoo/soap residue.  Turn the water OFF and apply half the bottle of CHG soap to a CLEAN washcloth.  Apply CHG soap ONLY FROM YOUR NECK DOWN TO YOUR TOES (washing for 3-5 minutes)  DO NOT use CHG soap on face, private areas, open wounds, or sores.  Pay special  attention to the area where your surgery is being performed.  If you are having back surgery, having someone wash your back for you may be helpful. Wait 2 minutes after CHG soap is applied, then you may rinse off the CHG soap.  Pat dry with a clean towel  Put on clean pajamas    Additional instructions for the day of surgery: If you choose, you may shower the morning of surgery with an antibacterial soap.  DO NOT APPLY any lotions, deodorants, cologne, or perfumes.   Do not wear jewelry or makeup Do not wear nail polish, gel polish, artificial nails, or any other type of covering on natural nails (fingers and toes) Do not bring valuables to the hospital. [facility] is not responsible for valuables/personal belongings. Put on clean/comfortable clothes.  Please brush your teeth.  Ask your nurse before applying any prescription medications to the skin.

## 2024-04-19 ENCOUNTER — Other Ambulatory Visit: Payer: Self-pay

## 2024-04-19 ENCOUNTER — Telehealth: Payer: Self-pay

## 2024-04-19 ENCOUNTER — Encounter (HOSPITAL_COMMUNITY)
Admission: RE | Admit: 2024-04-19 | Discharge: 2024-04-19 | Disposition: A | Discharge status: Home / Self Care | Source: Ambulatory Visit | Attending: Neurosurgery | Admitting: Neurosurgery

## 2024-04-19 ENCOUNTER — Encounter (HOSPITAL_COMMUNITY): Payer: Self-pay

## 2024-04-19 VITALS — BP 124/75 | HR 67 | Temp 98.6°F | Resp 17 | Ht 65.0 in | Wt 222.8 lb

## 2024-04-19 DIAGNOSIS — Z01818 Encounter for other preprocedural examination: Secondary | ICD-10-CM | POA: Insufficient documentation

## 2024-04-19 HISTORY — DX: Trigeminal neuralgia: G50.0

## 2024-04-19 HISTORY — DX: Peripheral vascular disease, unspecified: I73.9

## 2024-04-19 HISTORY — DX: Cardiac arrhythmia, unspecified: I49.9

## 2024-04-19 HISTORY — DX: Anxiety disorder, unspecified: F41.9

## 2024-04-19 NOTE — Telephone Encounter (Signed)
 Patient's surgery is approved for 23 hour observation. If patient needs a longer admission then [facility] team will file a claim for a longer stay.

## 2024-04-19 NOTE — Telephone Encounter (Signed)
 Called pt about MyChart message she sent. Explained letter to pt and will discuss changing the status with Rosaline when she returns after lunch. Pt is approved for observation stay for the surgery on 04/25/24.

## 2024-04-19 NOTE — Progress Notes (Addendum)
 PCP - Burnard Idell Rigg, NP Cardiologist - Pt is pending new consult with Dr. Maude Emmer 05/04/24 for abnormal EKG  PPM/ICD - Denies Device Orders - n/a Rep Notified - n/a  Chest x-ray - n/a EKG - 03/28/2024 Stress Test - Denies ECHO - Denies Cardiac Cath - Denies  Sleep Study - Denies CPAP - n/a  No DM  Last dose of GLP1 agonist- n/a GLP1 instructions: n/a  Blood Thinner Instructions: Per surgeon instructions, pt is to continue taking Plavix  including morning of surgery Aspirin  Instructions: n/a. Pt has not been on ASA for a few months per Neurology  NPO after midnight  COVID TEST- n/a   Anesthesia review: Yes. Pt has a pending cardiology consult with Dr. Emmer on 05/04/2024 for abnormal EKG (RBBB). Discussed with Lynwood Hope, PA-C who said as long as pt is asymptomatic, which she is, she should be able to proceed with surgery. To note, pts serum Hcg was positive on 10/7 and she has followed up extensively with her [facility] Dr. Marget. MD states that she is perimenopausal, which can increase her Hcg, giving a false positive result. She has had multiple redraws and they have all been at an 8, which per MD is the normal/negative pregnancy level.  Patient denies shortness of breath, fever, cough and chest pain at PAT appointment. Pt denies any respiratory illness/infection in the last two months.   All instructions explained to the patient, with a verbal understanding of the material. Patient agrees to go over the instructions while at home for a better understanding. Patient also instructed to self quarantine after being tested for COVID-19. The opportunity to ask questions was provided.

## 2024-04-20 ENCOUNTER — Encounter (HOSPITAL_COMMUNITY): Payer: Self-pay

## 2024-04-24 NOTE — Progress Notes (Signed)
 Spoke with the pt, she will arrive tom at 0830. Reminded pt to NPO post mn.

## 2024-04-25 ENCOUNTER — Ambulatory Visit (HOSPITAL_COMMUNITY): Payer: Self-pay | Admitting: Certified Registered Nurse Anesthetist

## 2024-04-25 ENCOUNTER — Ambulatory Visit (HOSPITAL_BASED_OUTPATIENT_CLINIC_OR_DEPARTMENT_OTHER)
Admission: RE | Admit: 2024-04-25 | Discharge: 2024-04-25 | Disposition: A | Discharge status: Home / Self Care | Source: Ambulatory Visit | Attending: Neurosurgery | Admitting: Neurosurgery

## 2024-04-25 ENCOUNTER — Other Ambulatory Visit: Payer: Self-pay

## 2024-04-25 ENCOUNTER — Ambulatory Visit: Payer: Self-pay | Admitting: Neuroradiology

## 2024-04-25 ENCOUNTER — Observation Stay (HOSPITAL_COMMUNITY)
Admission: RE | Admit: 2024-04-25 | Discharge: 2024-04-26 | Disposition: A | Discharge status: Home / Self Care | Attending: Internal Medicine | Admitting: Internal Medicine

## 2024-04-25 ENCOUNTER — Ambulatory Visit (HOSPITAL_COMMUNITY): Payer: Self-pay | Admitting: Vascular Surgery

## 2024-04-25 ENCOUNTER — Encounter (HOSPITAL_COMMUNITY)
Admission: RE | Disposition: A | Discharge status: Home / Self Care | Payer: Self-pay | Source: Home / Self Care | Attending: Neuroradiology

## 2024-04-25 ENCOUNTER — Encounter (HOSPITAL_COMMUNITY): Payer: Self-pay | Admitting: Neurosurgery

## 2024-04-25 DIAGNOSIS — F419 Anxiety disorder, unspecified: Secondary | ICD-10-CM

## 2024-04-25 DIAGNOSIS — Z7982 Long term (current) use of aspirin: Secondary | ICD-10-CM | POA: Diagnosis not present

## 2024-04-25 DIAGNOSIS — Z8673 Personal history of transient ischemic attack (TIA), and cerebral infarction without residual deficits: Secondary | ICD-10-CM | POA: Diagnosis not present

## 2024-04-25 DIAGNOSIS — I739 Peripheral vascular disease, unspecified: Secondary | ICD-10-CM | POA: Diagnosis not present

## 2024-04-25 DIAGNOSIS — Z79899 Other long term (current) drug therapy: Secondary | ICD-10-CM | POA: Insufficient documentation

## 2024-04-25 DIAGNOSIS — I671 Cerebral aneurysm, nonruptured: Secondary | ICD-10-CM | POA: Diagnosis present

## 2024-04-25 DIAGNOSIS — Z7902 Long term (current) use of antithrombotics/antiplatelets: Secondary | ICD-10-CM | POA: Insufficient documentation

## 2024-04-25 DIAGNOSIS — Z87891 Personal history of nicotine dependence: Secondary | ICD-10-CM | POA: Diagnosis not present

## 2024-04-25 DIAGNOSIS — Z01818 Encounter for other preprocedural examination: Secondary | ICD-10-CM

## 2024-04-25 HISTORY — PX: IR TRANSCATH/EMBOLIZ: IMG695

## 2024-04-25 HISTORY — PX: IR ANGIO INTRA EXTRACRAN SEL INTERNAL CAROTID UNI R MOD SED: IMG5362

## 2024-04-25 HISTORY — PX: IR US GUIDE VASC ACCESS RIGHT: IMG2390

## 2024-04-25 HISTORY — PX: IR NEURO EACH ADD'L AFTER BASIC UNI RIGHT (MS): IMG5374

## 2024-04-25 HISTORY — PX: IR ANGIOGRAM FOLLOW UP STUDY: IMG697

## 2024-04-25 HISTORY — PX: RADIOLOGY WITH ANESTHESIA: SHX6223

## 2024-04-25 LAB — MRSA NEXT GEN BY PCR, NASAL: MRSA by PCR Next Gen: NOT DETECTED

## 2024-04-25 LAB — POCT ACTIVATED CLOTTING TIME: Activated Clotting Time: 210 s

## 2024-04-25 LAB — POCT PREGNANCY, URINE: Preg Test, Ur: NEGATIVE

## 2024-04-25 SURGERY — RADIOLOGY WITH ANESTHESIA
Anesthesia: General

## 2024-04-25 MED ORDER — LIDOCAINE 2% (20 MG/ML) 5 ML SYRINGE
INTRAMUSCULAR | Status: DC | PRN
  Administered 2024-04-25: 100 mg via INTRAVENOUS

## 2024-04-25 MED ORDER — PROPOFOL 10 MG/ML IV BOLUS
INTRAVENOUS | Status: DC | PRN
  Administered 2024-04-25: 120 mg via INTRAVENOUS

## 2024-04-25 MED ORDER — VERAPAMIL HCL 2.5 MG/ML IV SOLN
INTRAVENOUS | Status: AC | PRN
  Administered 2024-04-25: 5 mg via INTRAVENOUS

## 2024-04-25 MED ORDER — DROPERIDOL 2.5 MG/ML IJ SOLN
0.6250 mg | Freq: Once | INTRAMUSCULAR | Status: DC | PRN
Start: 2024-04-25 — End: 2024-04-25

## 2024-04-25 MED ORDER — VERAPAMIL HCL 2.5 MG/ML IV SOLN
INTRAVENOUS | Status: AC
  Filled 2024-04-25: qty 2

## 2024-04-25 MED ORDER — ACETAMINOPHEN 10 MG/ML IV SOLN: 1000.0000 mg | Freq: Once | INTRAVENOUS | Status: DC | PRN

## 2024-04-25 MED ORDER — SUGAMMADEX SODIUM 200 MG/2ML IV SOLN
INTRAVENOUS | Status: DC | PRN
  Administered 2024-04-25: 200 mg via INTRAVENOUS

## 2024-04-25 MED ORDER — LACTATED RINGERS IV SOLN: INTRAVENOUS | Status: DC

## 2024-04-25 MED ORDER — OXCARBAZEPINE 300 MG PO TABS
300.0000 mg | ORAL_TABLET | Freq: Two times a day (BID) | ORAL | Status: DC
  Administered 2024-04-25 – 2024-04-26 (×3): 300 mg via ORAL
  Filled 2024-04-25 (×4): qty 1

## 2024-04-25 MED ORDER — ACETAMINOPHEN 650 MG RE SUPP: 650.0000 mg | RECTAL | Status: DC | PRN

## 2024-04-25 MED ORDER — IOHEXOL 300 MG/ML  SOLN
100.0000 mL | Freq: Once | INTRAMUSCULAR | Status: AC | PRN
  Administered 2024-04-25: 50 mL via INTRA_ARTERIAL

## 2024-04-25 MED ORDER — METHOCARBAMOL 500 MG PO TABS
750.0000 mg | ORAL_TABLET | Freq: Three times a day (TID) | ORAL | Status: DC | PRN
Start: 2024-04-25 — End: 2024-04-26

## 2024-04-25 MED ORDER — DEXAMETHASONE SOD PHOSPHATE PF 10 MG/ML IJ SOLN
INTRAMUSCULAR | Status: DC | PRN
  Administered 2024-04-25: 10 mg via INTRAVENOUS

## 2024-04-25 MED ORDER — ORAL CARE MOUTH RINSE: 15.0000 mL | OROMUCOSAL | Status: DC | PRN

## 2024-04-25 MED ORDER — ACETAMINOPHEN 325 MG PO TABS
650.0000 mg | ORAL_TABLET | ORAL | Status: DC | PRN
  Administered 2024-04-25 – 2024-04-26 (×5): 650 mg via ORAL
  Filled 2024-04-25 (×5): qty 2

## 2024-04-25 MED ORDER — CLEVIDIPINE BUTYRATE 0.5 MG/ML IV EMUL
INTRAVENOUS | Status: AC
  Filled 2024-04-25: qty 50

## 2024-04-25 MED ORDER — ORAL CARE MOUTH RINSE: 15.0000 mL | Freq: Once | OROMUCOSAL | Status: AC

## 2024-04-25 MED ORDER — LIDOCAINE-EPINEPHRINE 1 %-1:100000 IJ SOLN: 10.0000 mL | Freq: Once | INTRAMUSCULAR | Status: DC

## 2024-04-25 MED ORDER — FENTANYL CITRATE (PF) 100 MCG/2ML IJ SOLN: 25.0000 ug | INTRAMUSCULAR | Status: DC | PRN

## 2024-04-25 MED ORDER — FENTANYL CITRATE (PF) 100 MCG/2ML IJ SOLN
INTRAMUSCULAR | Status: AC
  Filled 2024-04-25: qty 2

## 2024-04-25 MED ORDER — LIDOCAINE HCL 1 % IJ SOLN
INTRAMUSCULAR | Status: AC
Start: 2024-04-25 — End: 2024-04-25
  Filled 2024-04-25: qty 20

## 2024-04-25 MED ORDER — ROCURONIUM BROMIDE 10 MG/ML (PF) SYRINGE
PREFILLED_SYRINGE | INTRAVENOUS | Status: DC | PRN
  Administered 2024-04-25: 20 mg via INTRAVENOUS
  Administered 2024-04-25: 60 mg via INTRAVENOUS

## 2024-04-25 MED ORDER — FENTANYL CITRATE (PF) 250 MCG/5ML IJ SOLN
INTRAMUSCULAR | Status: DC | PRN
  Administered 2024-04-25: 100 ug via INTRAVENOUS

## 2024-04-25 MED ORDER — PHENYLEPHRINE HCL-NACL 20-0.9 MG/250ML-% IV SOLN
INTRAVENOUS | Status: DC | PRN
  Administered 2024-04-25: 25 ug/min via INTRAVENOUS

## 2024-04-25 MED ORDER — HEPARIN SODIUM (PORCINE) 1000 UNIT/ML IJ SOLN
INTRAMUSCULAR | Status: DC | PRN
  Administered 2024-04-25: 4000 [IU] via INTRAVENOUS
  Administered 2024-04-25: 1000 [IU] via INTRAVENOUS

## 2024-04-25 MED ORDER — CLOPIDOGREL BISULFATE 75 MG PO TABS
75.0000 mg | ORAL_TABLET | Freq: Every day | ORAL | Status: DC
  Administered 2024-04-26: 75 mg via ORAL
  Filled 2024-04-25: qty 1

## 2024-04-25 MED ORDER — OXYCODONE HCL 5 MG PO TABS: 5.0000 mg | ORAL_TABLET | Freq: Once | ORAL | Status: DC | PRN

## 2024-04-25 MED ORDER — SODIUM CHLORIDE 0.9 % IV BOLUS: 250.0000 mL | INTRAVENOUS | Status: AC | PRN

## 2024-04-25 MED ORDER — ONDANSETRON HCL 4 MG/2ML IJ SOLN
INTRAMUSCULAR | Status: DC | PRN
  Administered 2024-04-25: 4 mg via INTRAVENOUS

## 2024-04-25 MED ORDER — OXYCODONE HCL 5 MG/5ML PO SOLN: 5.0000 mg | Freq: Once | ORAL | Status: DC | PRN

## 2024-04-25 MED ORDER — CHLORHEXIDINE GLUCONATE 0.12 % MT SOLN
15.0000 mL | Freq: Once | OROMUCOSAL | Status: AC
  Administered 2024-04-25: 15 mL via OROMUCOSAL
  Filled 2024-04-25: qty 15

## 2024-04-25 MED ORDER — ALPRAZOLAM 0.5 MG PO TABS
0.5000 mg | ORAL_TABLET | Freq: Three times a day (TID) | ORAL | Status: DC | PRN
Start: 2024-04-25 — End: 2024-04-26
  Administered 2024-04-26: 0.5 mg via ORAL
  Filled 2024-04-25: qty 1

## 2024-04-25 MED ORDER — CHLORHEXIDINE GLUCONATE CLOTH 2 % EX PADS: 6.0000 | MEDICATED_PAD | Freq: Every day | CUTANEOUS | Status: DC

## 2024-04-25 MED ORDER — EPHEDRINE SULFATE (PRESSORS) 25 MG/5ML IV SOSY
PREFILLED_SYRINGE | INTRAVENOUS | Status: DC | PRN
  Administered 2024-04-25: 5 mg via INTRAVENOUS

## 2024-04-25 MED ORDER — ACETAMINOPHEN 160 MG/5ML PO SOLN: 650.0000 mg | ORAL | Status: DC | PRN

## 2024-04-25 NOTE — Consult Note (Signed)
 NAME:  Emily Reynolds, MRN:  [id number], DOB:  09-11-1975, LOS: 0 ADMISSION DATE:  04/25/2024, CONSULTATION DATE: 04/25/2024 REFERRING MD:  Lester Golas, MD , CHIEF COMPLAINT: Status post coil embolization of P-comm aneurysm  History of Present Illness:  48 year old female with anxiety disorder, prior history of TIA who initially presented after she had left eye drooping, which prompted neurological evaluation, on workup she was noted to have right P-comm aneurysm.  She underwent elective coil embolization of P-comm aneurysm today, postop she was transferred to ICU for close monitoring  Patient denies headache, dizziness, nausea, vomiting, fever, chills, dysuria, urgency or frequency  Pertinent  Medical History   Past Medical History:  Diagnosis Date   Anxiety    Dysrhythmia    RBBB   History of gastroesophageal reflux (GERD)    Peripheral vascular disease    Right Cerebral Aneurysm   Stroke (HCC) 12/2023   TIA   Trigeminal neuralgia     Significant Hospital Events: Including procedures, antibiotic start and stop dates in addition to other pertinent events     Interim History / Subjective:  As above  Objective    Blood pressure 109/68, pulse 75, temperature 98.2 F (36.8 C), temperature source Oral, resp. rate 12, height 5' 5 (1.651 m), weight 100.7 kg, SpO2 98%.        Intake/Output Summary (Last 24 hours) at 04/25/2024 1155 Last data filed at 04/25/2024 1106 Gross per 24 hour  Intake 1100 ml  Output --  Net 1100 ml   Filed Weights   04/25/24 0832  Weight: 100.7 kg    Examination: General: Middle-aged female, lying on the bed HEENT: [place]/AT, eyes anicteric.  moist mucus membranes Neuro: Alert, awake following commands Chest: Coarse breath sounds, no wheezes or rhonchi Heart: Regular rate and rhythm, no murmurs or gallops Abdomen: Soft, nontender, nondistended, bowel sounds present Angiogram site on right groin looks stable, no bleeding noted    Resolved  problem list   Assessment and Plan  Incidental P-comm aneurysm status post coil embolization Anxiety disorder Prior history of TIA  Continue neuro watch Continue aspirin  and Robaxin Watch for signs of bleeding around groin site Continue Plavix  Continue Xanax as needed Continue Trileptal   Labs   CBC: No results for input(s): WBC, NEUTROABS, HGB, HCT, MCV, PLT in the last 168 hours.  Basic Metabolic Panel: No results for input(s): NA, K, CL, CO2, GLUCOSE, BUN, CREATININE, CALCIUM, MG, PHOS in the last 168 hours. GFR: CrCl cannot be calculated (Patient's most recent lab result is older than the maximum 21 days allowed.). No results for input(s): PROCALCITON, WBC, LATICACIDVEN in the last 168 hours.  Liver Function Tests: No results for input(s): AST, ALT, ALKPHOS, BILITOT, PROT, ALBUMIN in the last 168 hours. No results for input(s): LIPASE, AMYLASE in the last 168 hours. No results for input(s): AMMONIA in the last 168 hours.  ABG    Component Value Date/Time   HCO3 25.2 12/29/2023 1008   TCO2 25 03/27/2024 1539   O2SAT 50 12/29/2023 1008     Coagulation Profile: No results for input(s): INR, PROTIME in the last 168 hours.  Cardiac Enzymes: No results for input(s): CKTOTAL, CKMB, CKMBINDEX, TROPONINI in the last 168 hours.  HbA1C: Hgb A1c MFr Bld  Date/Time Value Ref Range Status  01/10/2024 09:09 AM 5.0 4.8 - 5.6 % Final    Comment:             Prediabetes: 5.7 - 6.4  Diabetes: >6.4          Glycemic control for adults with diabetes: <7.0     CBG: No results for input(s): GLUCAP in the last 168 hours.  Review of Systems:   12 point review of system is significant for complaint mentioned in HPI, rest is negative  Past Medical History:  She,  has a past medical history of Anxiety, Dysrhythmia, History of gastroesophageal reflux (GERD), Peripheral vascular disease, Stroke (HCC)  (12/2023), and Trigeminal neuralgia.   Surgical History:   Past Surgical History:  Procedure Laterality Date   NO PAST SURGERIES       Social History:   reports that she quit smoking about 3 months ago. Her smoking use included cigarettes. She started smoking about 30 years ago. She has a 30.5 pack-year smoking history. She has never used smokeless tobacco. She reports current alcohol use. She reports that she does not use drugs.   Family History:  Her family history includes Diabetes in her mother; Lung cancer in her father.   Allergies Allergies  Allergen Reactions   Cephalexin Other (See Comments)    CAUSES BAD HEADACHES  Keflex     Home Medications  Prior to Admission medications   Medication Sig Start Date End Date Taking? Authorizing Provider  ascorbic acid (VITAMIN C) 500 MG tablet Take 500 mg by mouth daily.   Yes [provider]  clopidogrel  (PLAVIX ) 75 MG tablet Take 75 mg by mouth daily.   Yes [provider]  ibuprofen (ADVIL,MOTRIN) 200 MG tablet Take 400 mg by mouth every 6 (six) hours as needed for moderate pain (pain score 4-6).   Yes [provider]  methocarbamol (ROBAXIN) 750 MG tablet Take 1 tablet (750 mg total) by mouth 3 (three) times daily as needed (muscle spasm/pain). 03/27/24  Yes Bernard Drivers, MD  Omega-3 Fatty Acids (OMEGA 3 PO) Take 1 capsule by mouth daily.   Yes [provider]  Oxcarbazepine (TRILEPTAL) 300 MG tablet Take 300 mg by mouth 2 (two) times daily.   Yes [provider]  Tapinarof (VTAMA) 1 % CREA Apply 1 Application topically daily.   Yes [provider]  VITAMIN D-VITAMIN K PO Take 1 tablet by mouth daily.   Yes [provider]  XANAX 0.5 MG tablet Take 0.5 mg by mouth 3 (three) times daily as needed for anxiety. 03/29/24  Yes [provider]  aspirin  81 MG chewable tablet Chew 1 tablet (81 mg total) by mouth daily. Patient not taking: Reported on 04/17/2024 12/29/23    Ula Prentice SAUNDERS, MD        Valinda Novas, MD [facility] See Amion for pager If no response to pager, please call [phone] until 7pm After 7pm, Please call E-link [phone]    Calls

## 2024-04-25 NOTE — Anesthesia Preprocedure Evaluation (Addendum)
 Anesthesia Evaluation  Patient identified by MRN, date of birth, ID band Patient awake    Reviewed: Allergy & Precautions, H&P , NPO status , Patient's Chart, lab work & pertinent test results  Airway Mallampati: II  TM Distance: >3 FB Neck ROM: Full    Dental  (+) Upper Dentures   Pulmonary Patient abstained from smoking., former smoker   Pulmonary exam normal breath sounds clear to auscultation       Cardiovascular + Peripheral Vascular Disease  Normal cardiovascular exam Rhythm:Regular Rate:Normal     Neuro/Psych  PSYCHIATRIC DISORDERS Anxiety     Right PCA Aneurysm CVA, No Residual Symptoms    GI/Hepatic Neg liver ROS,GERD  ,,  Endo/Other  negative endocrine ROS    Renal/GU negative Renal ROS  negative genitourinary   Musculoskeletal negative musculoskeletal ROS (+)    Abdominal   Peds negative pediatric ROS (+)  Hematology negative hematology ROS (+)   Anesthesia Other Findings   Reproductive/Obstetrics negative OB ROS                              Anesthesia Physical Anesthesia Plan  ASA: 3  Anesthesia Plan: General   Post-op Pain Management: Minimal or no pain anticipated   Induction: Intravenous  PONV Risk Score and Plan: 3 and Ondansetron, Dexamethasone, Midazolam  and Treatment may vary due to age or medical condition  Airway Management Planned: Oral ETT  Additional Equipment: Arterial line  Intra-op Plan:   Post-operative Plan: Extubation in OR  Informed Consent: I have reviewed the patients History and Physical, chart, labs and discussed the procedure including the risks, benefits and alternatives for the proposed anesthesia with the patient or authorized representative who has indicated his/her understanding and acceptance.       Plan Discussed with: CRNA and Anesthesiologist  Anesthesia Plan Comments:          Anesthesia Quick Evaluation

## 2024-04-25 NOTE — Progress Notes (Signed)
 She feels well other than a mild left temporal headache.  Puncture site is intact.  No hematoma.  Pulses are intact.  Alert and oriented with normal speech expression, fluency and comprehension. Visual fields are full to confrontation.   Face is symmetric. Strength in the arms and legs is symmetric with no drift.  Sensation is normal and symmetric. No ataxia. No inattention.  Observe overnight, plan for DC in the morning

## 2024-04-25 NOTE — Transfer of Care (Signed)
 Immediate Anesthesia Transfer of Care Note  Patient: Emily Reynolds  Procedure(s) Performed: RADIOLOGY WITH ANESTHESIA  Patient Location: PACU  Anesthesia Type:General  Level of Consciousness: awake, alert , and oriented  Airway & Oxygen Therapy: Patient Spontanous Breathing  Post-op Assessment: Report given to RN and Post -op Vital signs reviewed and stable  Post vital signs: Reviewed and stable  Last Vitals:  Vitals Value Taken Time  BP 117/68 04/25/24 11:03  Temp    Pulse 80 04/25/24 11:06  Resp 19 04/25/24 11:06  SpO2 97 % 04/25/24 11:06  Vitals shown include unfiled device data.  Last Pain:  Vitals:   04/25/24 0845  TempSrc:   PainSc: 0-No pain         Complications: No notable events documented.

## 2024-04-25 NOTE — H&P (Signed)
   The note originally documented on this encounter has been moved the the encounter in which it belongs.

## 2024-04-25 NOTE — H&P (Signed)
 I had the pleasure of meeting Emily Reynolds in the office today.  Briefly, she had a episode of her left eye drooping which prompted a neurologic evaluation which included a brain MRI.  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  She has no headaches suggestive of previous aneurysmal rupture.  She had a CT arteriogram which confirmed the aneurysm and a catheter arteriogram both of which I reviewed.  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  There is a large P-comm but there is a P1 segment on the right.  The anterior communicating artery is also patent.  There is a smaller wide necked aneurysm on the left side.  Her past medical history is notable for anxiety.  She quit smoking in July of this year.  There is no history of cardiac disease.  She did have a right bundle branch block noted incidentally on an EKG.  No history of lung disease.  She does not have any history of renal disease.  Her blood pressure is 146/86 with a heart rate of 93. She appears comfortable. Her lungs are clear. Heart sounds are normal.  Regular rhythm. Abdomen is soft. Normal radial and dorsalis pedis pulses on both sides. Neurologic exam is intact.    I reviewed her chemistry study from 03/27/2024 which showed a creatinine of 1.1.  I reviewed theCBC from the same day which showed a normal hemoglobin of 13.7 and platelets of 183,000.  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  Recommendation:  I have explained the diagnosis of an unruptured aneurysm.  I have explained that aneurysms may bleed (rupture), and if this happens it can be fatal or disabling.  I have also explained that some aneurysms have a very low statistical risk of bleeding, in which case the risk of surgery may be higher than the risk of no surgery.  I estimated the risk of bleeding of this  particular aneurysm in the 5 to 10% range over the next 10 years.  I have estimated the surgical risk for significant complication in the 2 to 3% range.  She understands this and would like to proceed with treatment.

## 2024-04-25 NOTE — Anesthesia Postprocedure Evaluation (Signed)
 Anesthesia Post Note  Patient: Emily Reynolds  Procedure(s) Performed: RADIOLOGY WITH ANESTHESIA     Patient location during evaluation: PACU Anesthesia Type: General Level of consciousness: awake and alert Pain management: pain level controlled Vital Signs Assessment: post-procedure vital signs reviewed and stable Respiratory status: spontaneous breathing, nonlabored ventilation, respiratory function stable and patient connected to nasal cannula oxygen Cardiovascular status: blood pressure returned to baseline and stable Postop Assessment: no apparent nausea or vomiting Anesthetic complications: no   No notable events documented.  Last Vitals:  Vitals:   04/25/24 1200 04/25/24 1230  BP: 112/75 115/78  Pulse: 74 73  Resp: 17 14  Temp:    SpO2: 100% 99%    Last Pain:  Vitals:   04/25/24 1145  TempSrc: Oral  PainSc: 0-No pain                 Thom JONELLE Peoples

## 2024-04-25 NOTE — Anesthesia Procedure Notes (Signed)
 Procedure Name: Intubation Date/Time: 04/25/2024 9:37 AM  Performed by: Wynonia Alfonzo LABOR, CRNAPre-anesthesia Checklist: Patient identified, Emergency Drugs available, Suction available and Patient being monitored Patient Re-evaluated:Patient Re-evaluated prior to induction Oxygen Delivery Method: Circle System Utilized Preoxygenation: Pre-oxygenation with 100% oxygen Induction Type: IV induction Ventilation: Mask ventilation without difficulty Laryngoscope Size: Mac and 3 Grade View: Grade II Tube type: Oral Tube size: 7.0 mm Number of attempts: 1 Airway Equipment and Method: Stylet and Oral airway Placement Confirmation: ETT inserted through vocal cords under direct vision, positive ETCO2 and breath sounds checked- equal and bilateral Secured at: 22 cm Tube secured with: Tape Dental Injury: Teeth and Oropharynx as per pre-operative assessment

## 2024-04-25 NOTE — Sedation Documentation (Signed)
 ACT 210 notified Doctor Lester.

## 2024-04-26 ENCOUNTER — Encounter (HOSPITAL_COMMUNITY): Payer: Self-pay | Admitting: Neurosurgery

## 2024-04-26 DIAGNOSIS — I671 Cerebral aneurysm, nonruptured: Secondary | ICD-10-CM | POA: Diagnosis not present

## 2024-04-26 NOTE — Progress Notes (Signed)
D/C education given to Pt and all questions answered. No printed prescriptions to give or equipment to deliver. IV removed. Pt taken to car with all belongings.   

## 2024-04-26 NOTE — Discharge Summary (Addendum)
 Physician Discharge Summary         Patient ID: Emily Reynolds MRN: [id number] DOB/AGE: 1975-08-31 48 y.o.  Admit date: 04/25/2024 Discharge date: 04/26/2024  Discharge Diagnoses:    Active Hospital Problems  No active problems to display.    Resolved Hospital Problems   Diagnosis Date Noted Date Resolved   Brain aneurysm 04/25/2024 04/26/2024     Discharge summary   48 year old female with anxiety disorder, prior history of TIA who initially presented after she had left eye drooping, which prompted neurological evaluation, on workup she was noted to have right P-comm aneurysm. She underwent elective coil embolization of P-comm aneurysm 11/5, postop she was transferred to ICU for close monitoring.  Discharge Plan by Active Problems   Incidental P-comm aneurysm status post coil embolization Anxiety disorder Prior history of TIA -Continue current Plavix  prescription. Do not order a refill. -Okay to resume normal activities tomorrow 11/7 -Okay to resume exercise 11/10. -Follow-up appointment scheduled w/ Dr. Lester 06/07/2024  Procedures   04/25/24: IR coil embolization P-comm aneurysm w/ Dr. Lester  Consults  General: well-appearing female, sitting in bed in NAD HEENT: AT/[place], PERRL, 3mm bilaterally Pulm: normal inspiratory and expiratory effort on RA CV: RRR, no m/g/r GI: soft, non distended, non tender to palpation Ext: Left femoral site soft, no ecchymosis or signs of bleeding Neuro: A&O x3, no focal deficits, sensory/motor exam intact Discharge Exam: BP 101/72   Pulse 67   Temp 98.1 F (36.7 C) (Oral)   Resp 15   Ht 5' 5 (1.651 m)   Wt 100.7 kg   LMP  (LMP Unknown) Comment: pt has not had period in many years d/t IUD. IUD removed 01/12/2024  SpO2 100%   BMI 36.94 kg/m   Labs at discharge   Lab Results  Component Value Date   CREATININE 1.10 (H) 03/27/2024   BUN 16 03/27/2024   NA 141 03/27/2024   K 4.0 03/27/2024   CL 104 03/27/2024   CO2 24 03/27/2024    Lab Results  Component Value Date   WBC 6.3 03/27/2024   HGB 13.9 03/27/2024   HCT 41.0 03/27/2024   MCV 94.1 03/27/2024   PLT 183 03/27/2024   Lab Results  Component Value Date   ALT 34 03/27/2024   AST 20 03/27/2024   ALKPHOS 65 03/27/2024   BILITOT 0.7 03/27/2024   Lab Results  Component Value Date   INR 0.9 03/27/2024    Current radiological studies    No results found.  Disposition:    Discharge disposition: 01-Home or Self Care     11/6: Discharge home Discharge Instructions     Call MD for:  difficulty breathing, headache or visual disturbances   Complete by: As directed    Call MD for:  persistant dizziness or light-headedness   Complete by: As directed    Call MD for:  persistant nausea and vomiting   Complete by: As directed    Call MD for:  severe uncontrolled pain   Complete by: As directed    Call MD for:  temperature >100.4   Complete by: As directed    Diet - low sodium heart healthy   Complete by: As directed    Discharge instructions   Complete by: As directed    Rest today 04/26/24. Resume normal activities Friday April 27, 2024. Okay to resume exercise Monday April 30, 2024.  Finish current Plavix  prescription. Do NOT order a refill.  Okay to remove groin dressing this evening  11/6. Monitor site for signs of bleeding. Small amount of bruising at site normal.   Increase activity slowly   Complete by: As directed        Allergies as of 04/26/2024       Reactions   Cephalexin Other (See Comments)   CAUSES BAD HEADACHES Keflex        Medication List     STOP taking these medications    aspirin  81 MG chewable tablet       TAKE these medications    acetaminophen 325 MG tablet Commonly known as: TYLENOL Take 2 tablets (650 mg total) by mouth every 4 (four) hours as needed for headache.   ascorbic acid 500 MG tablet Commonly known as: VITAMIN C Take 500 mg by mouth daily.   clopidogrel  75 MG tablet Commonly  known as: PLAVIX  Take 75 mg by mouth daily. Notes to patient: Last taken at 10:41 AM on 04/26/24   ibuprofen 200 MG tablet Commonly known as: ADVIL Take 400 mg by mouth every 6 (six) hours as needed for moderate pain (pain score 4-6).   methocarbamol 750 MG tablet Commonly known as: ROBAXIN Take 1 tablet (750 mg total) by mouth 3 (three) times daily as needed (muscle spasm/pain).   OMEGA 3 PO Take 1 capsule by mouth daily.   Oxcarbazepine 300 MG tablet Commonly known as: TRILEPTAL Take 300 mg by mouth 2 (two) times daily. Notes to patient: Last taken at 10:41 AM on 04/26/24   VITAMIN D-VITAMIN K PO Take 1 tablet by mouth daily.   Vtama 1 % Crea Generic drug: Tapinarof Apply 1 Application topically daily.   Xanax 0.5 MG tablet Generic drug: ALPRAZolam Take 0.5 mg by mouth 3 (three) times daily as needed for anxiety. Notes to patient: Last taken at 02:13 AM on 04/26/24        Follow-up appointment   Dr. Lawerance 06/07/2024 Discharge Condition:    good  Physician Statement:   The Patient was personally examined, the discharge assessment and plan has been personally reviewed and I agree with ACNP Babcock's assessment and plan. 30 minutes of time have been dedicated to discharge assessment, planning and discharge instructions.   Pattricia Weiher, DNP, AGACNP-BC [facility]  Please see [email or web address] for pager details.  From 7A-7P if no response, please call [phone]. After hours, please call ELink [phone].

## 2024-04-26 NOTE — Progress Notes (Signed)
 Feels well.  Mild frontal HA responds to tylenol Neuro exam intact. Rt fem puncture intact. Lungs clear.  Heart sounds normal.  Continue plavix  3 weeks (she has prescription) Rest today. Normal activities tomorrow. Follow up with me arranged one month.

## 2024-04-27 ENCOUNTER — Encounter: Payer: Self-pay | Admitting: Neuroradiology

## 2024-04-27 NOTE — Progress Notes (Signed)
 CARDIOLOGY CONSULT NOTE       Patient ID: Emily Reynolds MRN: [id number] DOB/AGE: 1975/08/31 48 y.o.  Admit date: (Not on file) Referring Physician: Duwaine Primary Physician: Duwaine Burnard Amble, NP Primary Cardiologist: New Reason for Consultation: Abnormal ECG/edema/dizziness    HPI:  48 y.o. referred by Dr Duwaine for abnormal ECG, edema and dizziness. History of anxiety, reflux prior TIA and trigeminal neuralgia. 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 She presented with left eye drooping   Quit smoking July 2025.  RBBB noted incidentally on ECG. No cardiac history. Reviewed ECG from 03/28/24 SR rate 77 RBBB normal ST segments. No old one to compare. She has used diuretics intermittently for LE edema that is dependant and has had some mild postural symptoms in past. ? Monitor done earlier this year no results available. Panic attacks and anxiety regarding her aneurysm diagnosis.   She has had long standing LE lymphedema. No history of DVT/refulx. She does not take lasix regularly. Stopped smoking in July but no recent CXR/CT chest.   She works at [employer] with my friend Dr Rolan Molt   ROS All other systems reviewed and negative except as noted above  Past Medical History:  Diagnosis Date   Anxiety    Dysrhythmia    RBBB   History of gastroesophageal reflux (GERD)    Peripheral vascular disease    Right Cerebral Aneurysm   Stroke (HCC) 12/2023   TIA   Trigeminal neuralgia     Family History  Problem Relation Age of Onset   Lung cancer Father    Diabetes Mother     Social History   Socioeconomic History   Marital status: Married    Spouse name: Not on file   Number of children: Not on file   Years of education: Not on file   Highest education level: Not on file  Occupational History   Not on file  Tobacco Use   Smoking status: Former    Current packs/day: 0.00    Average packs/day: 1 pack/day for 30.5  years (30.5 ttl pk-yrs)    Types: Cigarettes    Start date: 57    Quit date: 12/29/2023    Years since quitting: 0.3   Smokeless tobacco: Never  Vaping Use   Vaping status: Former   Quit date: 12/29/2023  Substance and Sexual Activity   Alcohol use: Yes    Comment: Socially   Drug use: No   Sexual activity: Not Currently  Other Topics Concern   Not on file  Social History Narrative   Not on file   Social Drivers of Health   Financial Resource Strain: Not on file  Food Insecurity: No Food Insecurity (04/26/2024)   Hunger Vital Sign    Worried About Running Out of Food in the Last Year: Never true    Ran Out of Food in the Last Year: Never true  Transportation Needs: Not on file  Physical Activity: Not on file  Stress: Not on file  Social Connections: Not on file  Intimate Partner Violence: Not on file    Past Surgical History:  Procedure Laterality Date   IR ANGIO INTRA EXTRACRAN SEL COM CAROTID INNOMINATE BILAT MOD SED  03/02/2024   IR ANGIO INTRA EXTRACRAN SEL INTERNAL CAROTID UNI R MOD SED  04/25/2024   IR ANGIO VERTEBRAL SEL VERTEBRAL BILAT MOD SED  03/02/2024   IR ANGIOGRAM FOLLOW UP STUDY  04/25/2024   IR ANGIOGRAM  FOLLOW UP STUDY  04/25/2024   IR NEURO EACH ADD'L AFTER BASIC UNI RIGHT (MS)  04/25/2024   IR TRANSCATH/EMBOLIZ  04/25/2024   IR US  GUIDE VASC ACCESS RIGHT  04/25/2024   NO PAST SURGERIES     RADIOLOGY WITH ANESTHESIA N/A 04/25/2024   Procedure: RADIOLOGY WITH ANESTHESIA;  Surgeon: Rosslyn Dino HERO, MD;  Location: [facility] OR;  Service: Radiology;  Laterality: N/A;  Right PCA Aneurysm Coiling      Current Outpatient Medications:    acetaminophen (TYLENOL) 325 MG tablet, Take 2 tablets (650 mg total) by mouth every 4 (four) hours as needed for headache., Disp: , Rfl:    ascorbic acid (VITAMIN C) 500 MG tablet, Take 500 mg by mouth daily., Disp: , Rfl:    Calcium-Vitamins C & D (CALCIUM/C/D PO), Take 1 capsule by mouth daily at 6 (six) AM., Disp: , Rfl:     clopidogrel  (PLAVIX ) 75 MG tablet, Take 75 mg by mouth daily., Disp: , Rfl:    methocarbamol (ROBAXIN) 750 MG tablet, Take 1 tablet (750 mg total) by mouth 3 (three) times daily as needed (muscle spasm/pain)., Disp: 15 tablet, Rfl: 0   methylPREDNISolone (MEDROL DOSEPAK) 4 MG TBPK tablet, Take by mouth daily. -taper daily per package instructions., Disp: 21 tablet, Rfl: 0   Omega-3 Fatty Acids (OMEGA 3 PO), Take 1 capsule by mouth daily., Disp: , Rfl:    Oxcarbazepine (TRILEPTAL) 300 MG tablet, Take 300 mg by mouth 2 (two) times daily., Disp: , Rfl:    Tapinarof (VTAMA) 1 % CREA, Apply 1 Application topically daily., Disp: , Rfl:    VITAMIN D-VITAMIN K PO, Take 1 tablet by mouth daily., Disp: , Rfl:    XANAX 0.5 MG tablet, Take 0.5 mg by mouth 3 (three) times daily as needed for anxiety., Disp: , Rfl:     Physical Exam: Blood pressure 126/80, pulse 77, height 5' 5 (1.651 m), weight 223 lb (101.2 kg), SpO2 100%.    Affect appropriate Healthy:  appears stated age HEENT: normal Neck supple with no adenopathy JVP normal no bruits no thyromegaly Lungs clear with no wheezing and good diaphragmatic motion Heart:  S1/S2 no murmur, no rub, gallop or click PMI normal Abdomen: benighn, BS positve, no tenderness, no AAA no bruit.  No HSM or HJR Distal pulses intact with no bruits Bilateral LE lymph edema Neuro non-focal Skin warm and dry No muscular weakness   Labs:   Lab Results  Component Value Date   WBC 6.3 03/27/2024   HGB 13.9 03/27/2024   HCT 41.0 03/27/2024   MCV 94.1 03/27/2024   PLT 183 03/27/2024   No results for input(s): NA, K, CL, CO2, BUN, CREATININE, CALCIUM, PROT, BILITOT, ALKPHOS, ALT, AST, GLUCOSE in the last 168 hours.  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 A 7 French sheath was placed and connected to an arterial monitoring line. The 95 cm benchmark catheter was advanced over a [employer] into the right internal carotid artery. 5 mg of verapamil  were administered intra-arterially. The catheter was then advanced over the [facility] into the cavernous segment of the internal carotid artery. The aneurysm was then catheterized with a 45 degree XT 17 microcatheter and Aristotle 14 standard wire. The initial coil placed was a 5 x 15 XL soft, followed by a 3 x 6 tetra. This resulted in good packing of the aneurysm with occlusion of the aneurysm and the neck. The follow-up arteriogram demonstrated occlusion of the aneurysm with normal flow in the right middle cerebral artery, right anterior cerebral artery and right posterior cerebral artery territories. Hemostasis was obtained with Angio-Seal. FINDINGS: The initial angiogram of the right internal carotid artery demonstrates the 6 mm aneurysm as previously identified on the diagnostic angiogram. There is a large posterior communicating artery but there is a P1 segment on the right. The anterior and middle cerebral artery territories are normal. The angiogram  following embolization demonstrates occlusion of the aneurysm with no encroachment of the internal carotid artery. Normal flow in the anterior and middle cerebral artery territories with no distal emboli or other complication. 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 A 7 French sheath was placed and connected to an arterial monitoring line. The 95 cm benchmark catheter was advanced over a [employer] teen into the right internal carotid artery. 5 mg of verapamil  were administered intra-arterially. The catheter was then advanced over the [facility] into the cavernous segment of the internal carotid artery. The aneurysm was then catheterized with a 45 degree XT 17 microcatheter and Aristotle 14 standard wire. The initial coil placed was a 5 x 15 XL soft, followed by a 3 x 6 tetra. This resulted in good packing of the aneurysm with occlusion of the aneurysm and the neck. The follow-up arteriogram demonstrated occlusion of the aneurysm with normal flow in the right middle cerebral artery, right anterior cerebral artery and right posterior cerebral artery territories. Hemostasis was obtained with Angio-Seal. FINDINGS: The initial angiogram of the right internal carotid artery demonstrates the 6 mm aneurysm as previously identified on the diagnostic  angiogram. There is a large posterior communicating artery but there is a P1 segment on the right. The anterior and middle cerebral artery territories are normal. The angiogram following embolization demonstrates occlusion of the aneurysm with no encroachment of the internal carotid artery. Normal flow in the anterior and middle cerebral artery territories with no distal emboli or other complication. 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 A 7 French sheath was placed and connected to an arterial monitoring line. The 95 cm benchmark catheter was advanced over a [employer] teen into the right internal carotid artery. 5 mg of verapamil  were administered intra-arterially. The catheter was then advanced over the [facility] into the cavernous segment of the internal carotid artery. The aneurysm was then catheterized with a 45 degree XT 17 microcatheter and Aristotle 14 standard wire. The initial coil placed was a 5 x 15 XL soft, followed by a 3 x 6 tetra. This resulted in good packing of the aneurysm with occlusion of the aneurysm and the neck. The follow-up arteriogram demonstrated occlusion of the aneurysm with normal flow in the right middle cerebral artery, right anterior cerebral artery and right posterior cerebral artery  territories. Hemostasis was obtained with Angio-Seal. FINDINGS: The initial angiogram of the right internal carotid artery demonstrates the 6 mm aneurysm as previously identified on the diagnostic angiogram. There is a large posterior communicating artery but there is a P1 segment on the right. The anterior and middle cerebral artery territories are normal. The angiogram following embolization demonstrates occlusion of the aneurysm with no encroachment of the internal carotid artery. Normal flow in the anterior and middle cerebral artery territories with no distal emboli or other complication. 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 A 7 French sheath was placed and connected to an arterial monitoring line. The 95 cm benchmark catheter was advanced over a [employer] teen into the right internal carotid artery. 5 mg of verapamil  were administered intra-arterially. The catheter was then advanced over the [facility] into the cavernous segment of the internal carotid artery. The aneurysm was then catheterized with a 45 degree XT 17 microcatheter and Aristotle 14 standard wire. The initial coil placed was a 5 x 15 XL soft, followed by a 3 x 6 tetra. This resulted in good packing of the aneurysm with occlusion of the aneurysm and the neck. The  follow-up arteriogram demonstrated occlusion of the aneurysm with normal flow in the right middle cerebral artery, right anterior cerebral artery and right posterior cerebral artery territories. Hemostasis was obtained with Angio-Seal. FINDINGS: The initial angiogram of the right internal carotid artery demonstrates the 6 mm aneurysm as previously identified on the diagnostic angiogram. There is a large posterior communicating artery but there is a P1 segment on the right. The anterior and middle cerebral artery territories are normal. The angiogram following embolization demonstrates occlusion of the aneurysm with no encroachment  of the internal carotid artery. Normal flow in the anterior and middle cerebral artery territories with no distal emboli or other complication. 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 A 7 French sheath was placed and connected to an arterial monitoring line. The 95 cm benchmark catheter was advanced over a [employer] teen into the right internal carotid artery. 5 mg of verapamil  were administered intra-arterially. The catheter was then advanced over the [facility] into the cavernous segment of the internal carotid artery. The aneurysm was then catheterized with a 45 degree XT 17 microcatheter and Aristotle 14  standard wire. The initial coil placed was a 5 x 15 XL soft, followed by a 3 x 6 tetra. This resulted in good packing of the aneurysm with occlusion of the aneurysm and the neck. The follow-up arteriogram demonstrated occlusion of the aneurysm with normal flow in the right middle cerebral artery, right anterior cerebral artery and right posterior cerebral artery territories. Hemostasis was obtained with Angio-Seal. FINDINGS: The initial angiogram of the right internal carotid artery demonstrates the 6 mm aneurysm as previously identified on the diagnostic angiogram. There is a large posterior communicating artery but there is a P1 segment on the right. The anterior and middle cerebral artery territories are normal. The angiogram following embolization demonstrates occlusion of the aneurysm with no encroachment of the internal carotid artery. Normal flow in the anterior and middle cerebral artery territories with no distal emboli or other complication. 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 A 7 French sheath was placed and connected to an arterial monitoring line. The 95 cm benchmark catheter was advanced over a [employer] teen into the right internal carotid artery. 5 mg of verapamil  were administered  intra-arterially. The catheter was then advanced over the [facility] into the cavernous segment of the internal carotid artery. The aneurysm was then catheterized with a 45 degree XT 17 microcatheter and Aristotle 14 standard wire. The initial coil placed was a 5 x 15 XL soft, followed by a 3 x 6 tetra. This resulted in good packing of the aneurysm with occlusion of the aneurysm and the neck. The follow-up arteriogram demonstrated occlusion of the aneurysm with normal flow in the right middle cerebral artery, right anterior cerebral artery and right posterior cerebral artery territories. Hemostasis was obtained with Angio-Seal. FINDINGS: The initial angiogram of the right internal carotid  artery demonstrates the 6 mm aneurysm as previously identified on the diagnostic angiogram. There is a large posterior communicating artery but there is a P1 segment on the right. The anterior and middle cerebral artery territories are normal. The angiogram following embolization demonstrates occlusion of the aneurysm with no encroachment of the internal carotid artery. Normal flow in the anterior and middle cerebral artery territories with no distal emboli or other complication. 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   On: 04/27/2024 09:50    EKG: SR RBBB   ASSESSMENT AND PLAN:   RBBB: benign likely normal variant for her. 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 She has compression stockings. Refilled her lasix. Will order LE venous duplex to r/o chronic DVT and or reflux d/c Primary can consider referring to lymphedema clinic if needed   CXR Echo LE venous duplex  bilateral  F/U PRN  Signed: Maude Emmer 05/04/2024, 2:34 PM

## 2024-05-02 ENCOUNTER — Telehealth: Payer: Self-pay

## 2024-05-02 DIAGNOSIS — I671 Cerebral aneurysm, nonruptured: Secondary | ICD-10-CM

## 2024-05-02 MED ORDER — METHYLPREDNISOLONE 4 MG PO TBPK: ORAL_TABLET | ORAL | 0 refills | Status: AC

## 2024-05-02 NOTE — Telephone Encounter (Signed)
 Patient notified of medication. She is thankful for a quick response. I warned her of the potential side effects of being up at night more often. She indicates understanding of how to take this medication.   She will call back in a few days if this does not help.

## 2024-05-02 NOTE — Telephone Encounter (Signed)
 Patient called to discuss her symptoms.   She had headaches as expected and discussed by Dr. Janjua before the patient left the hospital.   When she returned to work yesterday, she started having pain on the right side of the head near the temple and down to the base of the skull.   She describes it as an aching intermittent pain that she rates 4-5/10 at the Right temple and back side of head down to the base of the skull.   No visual changes or ringing in the ears.   No numbness or tingling in the extremities.   She endorses no neurological deficits.   She is worried something is wrong. I told the patient that I would reach out to Dr. Janjua to get his thoughts and if she needed a medication to decrease some inflammation in the brain that could be causing some of her headaches.  I will call her back with an answer.

## 2024-05-03 NOTE — Addendum Note (Signed)
 Encounter addended by: Kwana Ringel H on: 05/03/2024 9:13 AM  Actions taken: Imaging Exam ended

## 2024-05-04 ENCOUNTER — Ambulatory Visit: Attending: Cardiovascular Disease | Admitting: Cardiovascular Disease

## 2024-05-04 ENCOUNTER — Ambulatory Visit (HOSPITAL_COMMUNITY)
Admission: RE | Admit: 2024-05-04 | Discharge: 2024-05-04 | Disposition: A | Discharge status: Home / Self Care | Source: Ambulatory Visit | Attending: Cardiovascular Disease | Admitting: Cardiovascular Disease

## 2024-05-04 ENCOUNTER — Encounter: Payer: Self-pay | Admitting: Cardiovascular Disease

## 2024-05-04 VITALS — BP 126/80 | HR 77 | Ht 65.0 in | Wt 223.0 lb

## 2024-05-04 DIAGNOSIS — Z720 Tobacco use: Secondary | ICD-10-CM

## 2024-05-04 DIAGNOSIS — R609 Edema, unspecified: Secondary | ICD-10-CM | POA: Diagnosis not present

## 2024-05-04 DIAGNOSIS — I671 Cerebral aneurysm, nonruptured: Secondary | ICD-10-CM | POA: Diagnosis not present

## 2024-05-04 NOTE — Patient Instructions (Signed)
 Medication Instructions:  Your physician recommends that you continue on your current medications as directed. Please refer to the Current Medication list given to you today.  *If you need a refill on your cardiac medications before your next appointment, please call your pharmacy*  Lab Work: None ordered If you have labs (blood work) drawn today and your tests are completely normal, you will receive your results only by: MyChart Message (if you have MyChart) OR A paper copy in the mail If you have any lab test that is abnormal or we need to change your treatment, we will call you to review the results.  Testing/Procedures: Your physician has requested that you have an echocardiogram. Echocardiography is a painless test that uses sound waves to create images of your heart. It provides your doctor with information about the size and shape of your heart and how well your heart's chambers and valves are working. This procedure takes approximately one hour. There are no restrictions for this procedure. Please do NOT wear cologne, perfume, aftershave, or lotions (deodorant is allowed). Please arrive 15 minutes prior to your appointment time.  Please note: We ask at that you not bring children with you during ultrasound (echo/ vascular) testing. Due to room size and safety concerns, children are not allowed in the ultrasound rooms during exams. Our front office staff cannot provide observation of children in our lobby area while testing is being conducted. An adult accompanying a patient to their appointment will only be allowed in the ultrasound room at the discretion of the ultrasound technician under special circumstances. We apologize for any inconvenience.   A chest x-ray has been ordered. You can have this done in this building.   Your physician has requested that you have a lower or upper extremity venous duplex. This test is an ultrasound of the veins in the legs or arms. It looks at venous  blood flow that carries blood from the heart to the legs or arms. Allow one hour for a Lower Venous exam. Allow thirty minutes for an Upper Venous exam. There are no restrictions or special instructions.  Please note: We ask at that you not bring children with you during ultrasound (echo/ vascular) testing. Due to room size and safety concerns, children are not allowed in the ultrasound rooms during exams. Our front office staff cannot provide observation of children in our lobby area while testing is being conducted. An adult accompanying a patient to their appointment will only be allowed in the ultrasound room at the discretion of the ultrasound technician under special circumstances. We apologize for any inconvenience.  Follow-Up: At [facility], you and your health needs are our priority.  As part of our continuing mission to provide you with exceptional heart care, our providers are all part of one team.  This team includes your primary Cardiologist (physician) and Advanced Practice Providers or APPs (Physician Assistants and Nurse Practitioners) who all work together to provide you with the care you need, when you need it.  Your next appointment:   As needed

## 2024-05-10 ENCOUNTER — Encounter (HOSPITAL_COMMUNITY): Payer: Self-pay | Admitting: Radiology

## 2024-05-11 ENCOUNTER — Ambulatory Visit: Payer: Self-pay | Admitting: Cardiovascular Disease

## 2024-05-11 ENCOUNTER — Ambulatory Visit (HOSPITAL_COMMUNITY)
Admission: RE | Admit: 2024-05-11 | Discharge: 2024-05-11 | Disposition: A | Discharge status: Home / Self Care | Source: Ambulatory Visit | Attending: Cardiovascular Disease | Admitting: Cardiovascular Disease

## 2024-05-11 DIAGNOSIS — R609 Edema, unspecified: Secondary | ICD-10-CM | POA: Diagnosis present

## 2024-05-11 DIAGNOSIS — I872 Venous insufficiency (chronic) (peripheral): Secondary | ICD-10-CM

## 2024-06-07 ENCOUNTER — Encounter: Admitting: Neuroradiology

## 2024-06-07 ENCOUNTER — Encounter: Payer: Self-pay | Admitting: Neuroradiology

## 2024-06-07 VITALS — BP 113/78 | HR 76 | Temp 98.0°F | Ht 65.0 in | Wt 239.8 lb

## 2024-06-07 DIAGNOSIS — I671 Cerebral aneurysm, nonruptured: Secondary | ICD-10-CM

## 2024-06-07 DIAGNOSIS — Z09 Encounter for follow-up examination after completed treatment for conditions other than malignant neoplasm: Secondary | ICD-10-CM

## 2024-06-07 NOTE — Progress Notes (Signed)
 I had the pleasure of seeing Emily Reynolds in the office today.  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  The femoral site is healed and she has no complaints related to that.  She did have unusual headaches after the procedure.  These were multifocal, involving the right side, left side and posterior aspect of the head.  There were also often very sudden.  She was prescribed a Medrol  Dosepak, and said the symptoms subsided after 4 days.  I think this is probably coincidental.  I do not think they were related to the aneurysm because they were not always in the same place, and the character of the headaches does not suggest pain from the aneurysm.  In any case the symptoms are improved.  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  Assessment:  1.  No complications from coiling of right posterior communicating artery aneurysm 2.  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  Recommendation:  I talked about the treatment of unruptured aneurysms, as I did before treatment of the right posterior communicating artery aneurysm.  I have explained that incidentally detected unruptured aneurysms may have a very low risk of bleeding.  I think this is the case with the left carotid aneurysm, which I would estimate the 10-year risk of bleeding around 2 to 3%.  I also talked about the potential treatment of the left carotid aneurysm, which could be treated with flow diversion.  I estimated the risk of any complication around the 2 to 3% range, and a serious complication around the 1% range.  My recommendation for the left carotid aneurysm is continued observation, as I think the risk of bleeding is low.  We are going to check an arteriogram in a year to evaluate the treatment of the right posterior communicating artery aneurysm, and at that  time we can also check the left carotid aneurysm.  I did offer treatment of the left carotid aneurysm if she was not comfortable with the above plan, but she is in agreement.

## 2024-06-08 ENCOUNTER — Encounter: Admitting: Neurosurgery

## 2024-06-22 ENCOUNTER — Ambulatory Visit (HOSPITAL_COMMUNITY)

## 2024-06-29 ENCOUNTER — Ambulatory Visit (HOSPITAL_COMMUNITY)
Admission: RE | Admit: 2024-06-29 | Discharge: 2024-06-29 | Disposition: A | Source: Ambulatory Visit | Attending: Cardiovascular Disease | Admitting: Cardiovascular Disease

## 2024-06-29 DIAGNOSIS — R609 Edema, unspecified: Secondary | ICD-10-CM | POA: Diagnosis present

## 2024-06-29 LAB — ECHOCARDIOGRAM COMPLETE
Area-P 1/2: 5.75 cm2
S' Lateral: 2.28 cm

## 2024-07-06 ENCOUNTER — Other Ambulatory Visit: Payer: Self-pay

## 2024-07-06 DIAGNOSIS — I671 Cerebral aneurysm, nonruptured: Secondary | ICD-10-CM

## 2024-07-13 ENCOUNTER — Ambulatory Visit: Attending: Vascular Surgery | Admitting: Physician Assistant

## 2024-07-13 VITALS — BP 119/77 | HR 77 | Temp 98.0°F | Ht 65.0 in | Wt 235.0 lb

## 2024-07-13 DIAGNOSIS — I872 Venous insufficiency (chronic) (peripheral): Secondary | ICD-10-CM | POA: Diagnosis not present

## 2024-07-13 NOTE — Progress Notes (Signed)
 "   Requested by:  Duwaine Burnard Amble, NP [phone] [address] [place],  [place] [address]  Reason for consultation: venous reflux    History of Present Illness   Emily Reynolds is a 49 y.o. (02/03/1976) female who presents for evaluation of venous reflux at request of her Cardiologist Dr. Delford. Patient explains that she has had increased swelling and tenderness in her legs over the past year. 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 She admittedly is not very active outside of work but has recently been trying to walk during her lunch breaks and she says she has an elliptical at her house she will try to start using. Over the past two weeks she started wearing knee high OTC compression stockings. She feels these are helping with her swelling and she has noticed an improvement in the tenderness of her legs. She otherwise has some scattered spider veins, but says these have been present since she was in middle school. These do not bother her. She recently stopped smoking and has had an increase in her weight that she feels likely also contributing to the swelling in her legs. She has no history of DVT. She does report her mother having spider veins but no other family history of venous disease.   Venous symptoms include: swelling,tenderness Onset/duration:  > 1 year  Occupation:  works in Aeronautical Engineer Aggravating factors: sitting, standing Alleviating factors: compression  Compression:  yes Helps:  yes  Pain medications:  no Previous vein procedures:  no History of DVT:  no  Past Medical History:  Diagnosis Date   Anxiety    Dysrhythmia    RBBB   History of gastroesophageal reflux (GERD)    Peripheral vascular disease    Right Cerebral Aneurysm   Stroke (HCC) 12/2023   TIA   Trigeminal neuralgia     Past Surgical History:  Procedure Laterality Date   IR ANGIO INTRA EXTRACRAN SEL COM CAROTID INNOMINATE BILAT  MOD SED  03/02/2024   IR ANGIO INTRA EXTRACRAN SEL INTERNAL CAROTID UNI R MOD SED  04/25/2024   IR ANGIO VERTEBRAL SEL VERTEBRAL BILAT MOD SED  03/02/2024   IR ANGIOGRAM FOLLOW UP STUDY  04/25/2024   IR ANGIOGRAM FOLLOW UP STUDY  04/25/2024   IR NEURO EACH ADD'L AFTER BASIC UNI RIGHT (MS)  04/25/2024   IR TRANSCATH/EMBOLIZ  04/25/2024   IR US  GUIDE VASC ACCESS RIGHT  04/25/2024   IR US  GUIDE VASC ACCESS RIGHT  03/02/2024   NO PAST SURGERIES     RADIOLOGY WITH ANESTHESIA N/A 04/25/2024   Procedure: RADIOLOGY WITH ANESTHESIA;  Surgeon: Rosslyn Dino HERO, MD;  Location: [facility] OR;  Service: Radiology;  Laterality: N/A;  Right PCA Aneurysm Coiling    Social History   Socioeconomic History   Marital status: Married    Spouse name: Not on file   Number of children: Not on file   Years of education: Not on file   Highest education level: Not on file  Occupational History   Not on file  Tobacco Use   Smoking status: Former    Current packs/day: 0.00    Average packs/day: 1 pack/day for 30.5 years (30.5 ttl pk-yrs)    Types: Cigarettes    Start date: 73    Quit date: 12/29/2023    Years since quitting: 0.5   Smokeless tobacco: Never  Vaping Use   Vaping status: Former  Quit date: 12/29/2023  Substance and Sexual Activity   Alcohol use: Yes    Comment: Socially   Drug use: No   Sexual activity: Not Currently  Other Topics Concern   Not on file  Social History Narrative   Not on file   Social Drivers of Health   Tobacco Use: Medium Risk (07/13/2024)   Patient History    Smoking Tobacco Use: Former    Smokeless Tobacco Use: Never    Passive Exposure: Not on Actuary Strain: Not on file  Food Insecurity: No Food Insecurity (04/26/2024)   Epic    Worried About Programme Researcher, Broadcasting/film/video in the Last Year: Never true    Ran Out of Food in the Last Year: Never true  Transportation Needs: Not on file  Physical Activity: Not on file  Stress: Not on file  Social Connections: Not on  file  Intimate Partner Violence: Not on file  Depression (EYV7-0): Not on file  Alcohol Screen: Not on file  Housing: Not on file  Utilities: Not on file  Health Literacy: Not on file    Family History  Problem Relation Age of Onset   Diabetes Mother    Lung cancer Father     Current Outpatient Medications  Medication Sig Dispense Refill   acetaminophen  (TYLENOL ) 325 MG tablet Take 2 tablets (650 mg total) by mouth every 4 (four) hours as needed for headache.     ascorbic acid (VITAMIN C) 500 MG tablet Take 500 mg by mouth daily.     Calcium-Vitamins C & D (CALCIUM/C/D PO) Take 1 capsule by mouth daily at 6 (six) AM.     clopidogrel  (PLAVIX ) 75 MG tablet Take 75 mg by mouth daily.     methocarbamol  (ROBAXIN ) 750 MG tablet Take 1 tablet (750 mg total) by mouth 3 (three) times daily as needed (muscle spasm/pain). 15 tablet 0   methylPREDNISolone  (MEDROL  DOSEPAK) 4 MG TBPK tablet Take by mouth daily. -taper daily per package instructions. 21 tablet 0   Omega-3 Fatty Acids (OMEGA 3 PO) Take 1 capsule by mouth daily.     Oxcarbazepine  (TRILEPTAL ) 300 MG tablet Take 300 mg by mouth 2 (two) times daily.     Tapinarof (VTAMA) 1 % CREA Apply 1 Application topically daily.     VITAMIN D-VITAMIN K PO Take 1 tablet by mouth daily.     XANAX  0.5 MG tablet Take 0.5 mg by mouth 3 (three) times daily as needed for anxiety.     No current facility-administered medications for this visit.    Allergies[1]  REVIEW OF SYSTEMS (negative unless checked):   Cardiac:  []  Chest pain or chest pressure? []  Shortness of breath upon activity? []  Shortness of breath when lying flat? []  Irregular heart rhythm?  Vascular:  []  Pain in calf, thigh, or hip brought on by walking? []  Pain in feet at night that wakes you up from your sleep? []  Blood clot in your veins? []  Leg swelling?  Pulmonary:  []  Oxygen at home? []  Productive cough? []  Wheezing?  Neurologic:  []  Sudden weakness in arms or  legs? []  Sudden numbness in arms or legs? []  Sudden onset of difficult speaking or slurred speech? []  Temporary loss of vision in one eye? []  Problems with dizziness?  Gastrointestinal:  []  Blood in stool? []  Vomited blood?  Genitourinary:  []  Burning when urinating? []  Blood in urine?  Psychiatric:  []  Major depression  Hematologic:  []  Bleeding problems? []  Problems with  blood clotting?  Dermatologic:  []  Rashes or ulcers?  Constitutional:  []  Fever or chills?  Ear/Nose/Throat:  []  Change in hearing? []  Nose bleeds? []  Sore throat?  Musculoskeletal:  []  Back pain? []  Joint pain? []  Muscle pain?   Physical Examination     Vitals:   07/13/24 1304  BP: 119/77  Pulse: 77  Temp: 98 F (36.7 C)  SpO2: 98%  Weight: 235 lb (106.6 kg)  Height: 5' 5 (1.651 m)   Body mass index is 39.11 kg/m.  General:  WDWN in NAD; vital signs documented above Gait: Normal HENT: WNL, normocephalic Pulmonary: normal non-labored breathing Cardiac: regular HR Abdomen: soft Vascular Exam/Pulses: Extremities: without varicose veins, without reticular veins, scattered spider veins in both legs,  with edema, without stasis pigmentation, without lipodermatosclerosis, without ulcers Musculoskeletal: no muscle wasting or atrophy  Neurologic: A&O X 3;  No focal weakness or paresthesias are detected Psychiatric:  The pt has Normal affect.  Non-invasive Vascular Imaging   BLE Venous Insufficiency Duplex (05/11/24):  RLE:  No DVT and SVT GSV reflux proximal thigh GSV diameter 0.46-0.52 in proximal thigh, not visualized distally SSV reflux mid calf CFV deep venous reflux AASV incompetent  LLE: No DVT and SVT GSV reflux SFJ, mid thigh  GSV diameter 0.51-0.59 only visible in the proximal thigh, too small distally No SSV reflux CFV deep venous reflux   Medical Decision Making   Emily Reynolds is a 49 y.o. female who presents with: BLE chronic venous insufficiency with  swelling and tenderness bilaterally. Duplex shows no DVT or SVT. She has bilateral superficial and deep reflux. Her GSV is small bilaterally. Based on this recent study she is not a candidate for a venous ablation. I reassured her that she has nothing serious going on with her legs and she should see a big improvement if she implements these conservative management recommendations.  Based on the patient's history and examination, I recommend: daily elevation of 20-30 minutes above level of heart, daily compression stocking use, exercise, weight reduction, refraining from prolonged sitting or standing. I discussed with the patient the use of her 15-20 mm knee high compression stockings  Patient was provided with information about vein health She can follow up as needed if any new or worsening symptoms    Teretha Damme, PA-C [facility] Office: [phone]  07/13/2024, 2:12 PM  Clinic MD: [place]     [1]  Allergies Allergen Reactions   Cephalexin Other (See Comments)    CAUSES BAD HEADACHES  Keflex   "

## 2024-12-11 ENCOUNTER — Ambulatory Visit: Admitting: Neurology

## 2025-05-31 ENCOUNTER — Ambulatory Visit: Admitting: Neurosurgery
